# Patient Record
Sex: Female | Born: 1965
Health system: Southern US, Community
[De-identification: ages and names within clinical notes are randomized; demographics above are authoritative.]

## PROBLEM LIST (undated history)

## (undated) DIAGNOSIS — K7581 Nonalcoholic steatohepatitis (NASH): Secondary | ICD-10-CM

## (undated) DIAGNOSIS — K449 Diaphragmatic hernia without obstruction or gangrene: Secondary | ICD-10-CM

## (undated) DIAGNOSIS — K219 Gastro-esophageal reflux disease without esophagitis: Secondary | ICD-10-CM

## (undated) DIAGNOSIS — E039 Hypothyroidism, unspecified: Secondary | ICD-10-CM

## (undated) DIAGNOSIS — T7840XA Allergy, unspecified, initial encounter: Secondary | ICD-10-CM

## (undated) DIAGNOSIS — K589 Irritable bowel syndrome without diarrhea: Secondary | ICD-10-CM

## (undated) DIAGNOSIS — K5790 Diverticulosis of intestine, part unspecified, without perforation or abscess without bleeding: Secondary | ICD-10-CM

## (undated) HISTORY — DX: Irritable bowel syndrome, unspecified: K58.9

## (undated) HISTORY — PX: APPENDECTOMY: SHX54

## (undated) HISTORY — DX: Diverticulosis of intestine, part unspecified, without perforation or abscess without bleeding: K57.90

## (undated) HISTORY — DX: Gilbert syndrome: E80.4

## (undated) HISTORY — PX: UPPER GASTROINTESTINAL ENDOSCOPY: SHX188

## (undated) HISTORY — PX: OTHER SURGICAL HISTORY: SHX169

## (undated) HISTORY — DX: Gastro-esophageal reflux disease without esophagitis: K21.9

## (undated) HISTORY — DX: Hypothyroidism, unspecified: E03.9

## (undated) HISTORY — DX: Nonalcoholic steatohepatitis (NASH): K75.81

## (undated) HISTORY — PX: PARTIAL HYSTERECTOMY: SHX80

## (undated) HISTORY — DX: Allergy, unspecified, initial encounter: T78.40XA

## (undated) HISTORY — DX: Diaphragmatic hernia without obstruction or gangrene: K44.9

---

## 2003-02-25 HISTORY — PX: KIDNEY STONE SURGERY: SHX686

## 2008-02-25 DIAGNOSIS — K9041 Non-celiac gluten sensitivity: Secondary | ICD-10-CM

## 2008-02-25 HISTORY — DX: Non-celiac gluten sensitivity: K90.41

## 2010-08-01 HISTORY — PX: COLONOSCOPY: SHX174

## 2013-06-02 HISTORY — PX: ESOPHAGOGASTRODUODENOSCOPY: SHX1529

## 2015-06-07 DIAGNOSIS — G43819 Other migraine, intractable, without status migrainosus: Secondary | ICD-10-CM | POA: Diagnosis not present

## 2015-06-07 DIAGNOSIS — K900 Celiac disease: Secondary | ICD-10-CM | POA: Diagnosis not present

## 2015-06-07 DIAGNOSIS — K5900 Constipation, unspecified: Secondary | ICD-10-CM | POA: Diagnosis not present

## 2015-06-07 DIAGNOSIS — R140 Abdominal distension (gaseous): Secondary | ICD-10-CM | POA: Diagnosis not present

## 2015-07-05 DIAGNOSIS — N951 Menopausal and female climacteric states: Secondary | ICD-10-CM | POA: Diagnosis not present

## 2015-07-05 DIAGNOSIS — R319 Hematuria, unspecified: Secondary | ICD-10-CM | POA: Diagnosis not present

## 2015-07-05 DIAGNOSIS — Z01419 Encounter for gynecological examination (general) (routine) without abnormal findings: Secondary | ICD-10-CM | POA: Diagnosis not present

## 2015-07-06 DIAGNOSIS — R319 Hematuria, unspecified: Secondary | ICD-10-CM | POA: Diagnosis not present

## 2015-07-12 DIAGNOSIS — G43819 Other migraine, intractable, without status migrainosus: Secondary | ICD-10-CM | POA: Diagnosis not present

## 2015-07-12 DIAGNOSIS — K900 Celiac disease: Secondary | ICD-10-CM | POA: Diagnosis not present

## 2015-07-12 DIAGNOSIS — R140 Abdominal distension (gaseous): Secondary | ICD-10-CM | POA: Diagnosis not present

## 2015-07-12 DIAGNOSIS — K5900 Constipation, unspecified: Secondary | ICD-10-CM | POA: Diagnosis not present

## 2015-08-13 DIAGNOSIS — Z1231 Encounter for screening mammogram for malignant neoplasm of breast: Secondary | ICD-10-CM | POA: Diagnosis not present

## 2015-08-24 DIAGNOSIS — R928 Other abnormal and inconclusive findings on diagnostic imaging of breast: Secondary | ICD-10-CM | POA: Diagnosis not present

## 2015-08-27 DIAGNOSIS — E039 Hypothyroidism, unspecified: Secondary | ICD-10-CM | POA: Diagnosis not present

## 2015-09-11 DIAGNOSIS — K581 Irritable bowel syndrome with constipation: Secondary | ICD-10-CM | POA: Diagnosis not present

## 2015-09-11 DIAGNOSIS — K760 Fatty (change of) liver, not elsewhere classified: Secondary | ICD-10-CM | POA: Diagnosis not present

## 2015-09-11 DIAGNOSIS — K900 Celiac disease: Secondary | ICD-10-CM | POA: Diagnosis not present

## 2015-11-20 DIAGNOSIS — K900 Celiac disease: Secondary | ICD-10-CM | POA: Diagnosis not present

## 2015-11-20 DIAGNOSIS — K760 Fatty (change of) liver, not elsewhere classified: Secondary | ICD-10-CM | POA: Diagnosis not present

## 2015-11-26 DIAGNOSIS — R1013 Epigastric pain: Secondary | ICD-10-CM | POA: Diagnosis not present

## 2015-12-27 DIAGNOSIS — E039 Hypothyroidism, unspecified: Secondary | ICD-10-CM | POA: Diagnosis not present

## 2015-12-27 DIAGNOSIS — J0100 Acute maxillary sinusitis, unspecified: Secondary | ICD-10-CM | POA: Diagnosis not present

## 2016-01-08 DIAGNOSIS — R110 Nausea: Secondary | ICD-10-CM | POA: Diagnosis not present

## 2016-01-08 DIAGNOSIS — K9041 Non-celiac gluten sensitivity: Secondary | ICD-10-CM | POA: Diagnosis not present

## 2016-01-08 DIAGNOSIS — R1013 Epigastric pain: Secondary | ICD-10-CM | POA: Diagnosis not present

## 2016-02-13 DIAGNOSIS — J00 Acute nasopharyngitis [common cold]: Secondary | ICD-10-CM | POA: Diagnosis not present

## 2016-03-01 DIAGNOSIS — T50995A Adverse effect of other drugs, medicaments and biological substances, initial encounter: Secondary | ICD-10-CM | POA: Diagnosis not present

## 2016-03-05 DIAGNOSIS — Z09 Encounter for follow-up examination after completed treatment for conditions other than malignant neoplasm: Secondary | ICD-10-CM | POA: Diagnosis not present

## 2016-03-05 DIAGNOSIS — R922 Inconclusive mammogram: Secondary | ICD-10-CM | POA: Diagnosis not present

## 2016-03-05 DIAGNOSIS — N6459 Other signs and symptoms in breast: Secondary | ICD-10-CM | POA: Diagnosis not present

## 2016-03-25 DIAGNOSIS — E039 Hypothyroidism, unspecified: Secondary | ICD-10-CM | POA: Diagnosis not present

## 2016-04-08 DIAGNOSIS — G43909 Migraine, unspecified, not intractable, without status migrainosus: Secondary | ICD-10-CM | POA: Diagnosis not present

## 2016-04-08 DIAGNOSIS — Z0000 Encounter for general adult medical examination without abnormal findings: Secondary | ICD-10-CM | POA: Diagnosis not present

## 2016-04-08 DIAGNOSIS — R790 Abnormal level of blood mineral: Secondary | ICD-10-CM | POA: Diagnosis not present

## 2016-04-08 DIAGNOSIS — Z131 Encounter for screening for diabetes mellitus: Secondary | ICD-10-CM | POA: Diagnosis not present

## 2016-04-08 DIAGNOSIS — E039 Hypothyroidism, unspecified: Secondary | ICD-10-CM | POA: Diagnosis not present

## 2016-04-09 DIAGNOSIS — Z79899 Other long term (current) drug therapy: Secondary | ICD-10-CM | POA: Diagnosis not present

## 2016-04-09 DIAGNOSIS — E039 Hypothyroidism, unspecified: Secondary | ICD-10-CM | POA: Diagnosis not present

## 2016-07-18 DIAGNOSIS — E039 Hypothyroidism, unspecified: Secondary | ICD-10-CM | POA: Diagnosis not present

## 2016-09-23 DIAGNOSIS — D485 Neoplasm of uncertain behavior of skin: Secondary | ICD-10-CM | POA: Diagnosis not present

## 2016-09-23 DIAGNOSIS — D1801 Hemangioma of skin and subcutaneous tissue: Secondary | ICD-10-CM | POA: Diagnosis not present

## 2016-11-04 DIAGNOSIS — M79642 Pain in left hand: Secondary | ICD-10-CM | POA: Diagnosis not present

## 2016-11-04 DIAGNOSIS — Z6821 Body mass index (BMI) 21.0-21.9, adult: Secondary | ICD-10-CM | POA: Diagnosis not present

## 2016-11-04 DIAGNOSIS — R202 Paresthesia of skin: Secondary | ICD-10-CM | POA: Diagnosis not present

## 2016-11-04 DIAGNOSIS — K760 Fatty (change of) liver, not elsewhere classified: Secondary | ICD-10-CM | POA: Diagnosis not present

## 2016-11-25 DIAGNOSIS — K760 Fatty (change of) liver, not elsewhere classified: Secondary | ICD-10-CM | POA: Diagnosis not present

## 2016-11-25 DIAGNOSIS — K9041 Non-celiac gluten sensitivity: Secondary | ICD-10-CM | POA: Diagnosis not present

## 2017-01-29 DIAGNOSIS — D485 Neoplasm of uncertain behavior of skin: Secondary | ICD-10-CM | POA: Diagnosis not present

## 2017-02-04 DIAGNOSIS — E039 Hypothyroidism, unspecified: Secondary | ICD-10-CM | POA: Diagnosis not present

## 2017-04-16 DIAGNOSIS — E039 Hypothyroidism, unspecified: Secondary | ICD-10-CM | POA: Diagnosis not present

## 2017-04-16 DIAGNOSIS — Z6821 Body mass index (BMI) 21.0-21.9, adult: Secondary | ICD-10-CM | POA: Diagnosis not present

## 2017-04-16 DIAGNOSIS — Z1331 Encounter for screening for depression: Secondary | ICD-10-CM | POA: Diagnosis not present

## 2017-04-16 DIAGNOSIS — E7212 Methylenetetrahydrofolate reductase deficiency: Secondary | ICD-10-CM | POA: Diagnosis not present

## 2017-06-22 DIAGNOSIS — M545 Low back pain: Secondary | ICD-10-CM | POA: Diagnosis not present

## 2017-06-22 DIAGNOSIS — M546 Pain in thoracic spine: Secondary | ICD-10-CM | POA: Diagnosis not present

## 2017-06-22 DIAGNOSIS — M542 Cervicalgia: Secondary | ICD-10-CM | POA: Diagnosis not present

## 2017-06-23 DIAGNOSIS — M546 Pain in thoracic spine: Secondary | ICD-10-CM | POA: Diagnosis not present

## 2017-06-23 DIAGNOSIS — M542 Cervicalgia: Secondary | ICD-10-CM | POA: Diagnosis not present

## 2017-06-23 DIAGNOSIS — M545 Low back pain: Secondary | ICD-10-CM | POA: Diagnosis not present

## 2017-06-25 DIAGNOSIS — M545 Low back pain: Secondary | ICD-10-CM | POA: Diagnosis not present

## 2017-06-25 DIAGNOSIS — M542 Cervicalgia: Secondary | ICD-10-CM | POA: Diagnosis not present

## 2017-06-25 DIAGNOSIS — M546 Pain in thoracic spine: Secondary | ICD-10-CM | POA: Diagnosis not present

## 2017-07-01 DIAGNOSIS — M546 Pain in thoracic spine: Secondary | ICD-10-CM | POA: Diagnosis not present

## 2017-07-01 DIAGNOSIS — M542 Cervicalgia: Secondary | ICD-10-CM | POA: Diagnosis not present

## 2017-07-01 DIAGNOSIS — M545 Low back pain: Secondary | ICD-10-CM | POA: Diagnosis not present

## 2017-08-06 DIAGNOSIS — E039 Hypothyroidism, unspecified: Secondary | ICD-10-CM | POA: Diagnosis not present

## 2017-08-19 DIAGNOSIS — N951 Menopausal and female climacteric states: Secondary | ICD-10-CM | POA: Diagnosis not present

## 2017-08-19 DIAGNOSIS — Z1231 Encounter for screening mammogram for malignant neoplasm of breast: Secondary | ICD-10-CM | POA: Diagnosis not present

## 2017-08-19 DIAGNOSIS — Z01419 Encounter for gynecological examination (general) (routine) without abnormal findings: Secondary | ICD-10-CM | POA: Diagnosis not present

## 2017-09-01 DIAGNOSIS — Z1231 Encounter for screening mammogram for malignant neoplasm of breast: Secondary | ICD-10-CM | POA: Diagnosis not present

## 2017-11-27 DIAGNOSIS — G43819 Other migraine, intractable, without status migrainosus: Secondary | ICD-10-CM | POA: Diagnosis not present

## 2017-11-27 DIAGNOSIS — G5711 Meralgia paresthetica, right lower limb: Secondary | ICD-10-CM | POA: Diagnosis not present

## 2017-11-27 DIAGNOSIS — N951 Menopausal and female climacteric states: Secondary | ICD-10-CM | POA: Diagnosis not present

## 2017-11-27 DIAGNOSIS — Z23 Encounter for immunization: Secondary | ICD-10-CM | POA: Diagnosis not present

## 2017-11-27 DIAGNOSIS — J3089 Other allergic rhinitis: Secondary | ICD-10-CM | POA: Diagnosis not present

## 2017-11-27 DIAGNOSIS — R490 Dysphonia: Secondary | ICD-10-CM | POA: Diagnosis not present

## 2017-11-27 DIAGNOSIS — K599 Functional intestinal disorder, unspecified: Secondary | ICD-10-CM | POA: Diagnosis not present

## 2017-11-27 DIAGNOSIS — R140 Abdominal distension (gaseous): Secondary | ICD-10-CM | POA: Diagnosis not present

## 2017-12-03 DIAGNOSIS — H00011 Hordeolum externum right upper eyelid: Secondary | ICD-10-CM | POA: Diagnosis not present

## 2018-02-08 DIAGNOSIS — E039 Hypothyroidism, unspecified: Secondary | ICD-10-CM | POA: Diagnosis not present

## 2018-08-10 DIAGNOSIS — E039 Hypothyroidism, unspecified: Secondary | ICD-10-CM | POA: Diagnosis not present

## 2018-08-23 ENCOUNTER — Telehealth: Payer: Self-pay | Admitting: Gastroenterology

## 2018-08-23 NOTE — Telephone Encounter (Signed)
The pt was called and she states she is on a conference call for her work and will have to call back at a later time.

## 2018-08-23 NOTE — Telephone Encounter (Signed)
The pt asked if she needed to be checked for C diff.  She has been on an antibiotic recently for an eye infection.  She is currently at [facility].  She has no diarrhea and no fever.  She says she only had some rectal pressure which got significantly better.  She has an appt with Dr Lyndel Safe on 7/15.  She was advised that if she continues to have symptoms she could see an urgent care in [place].  The pt has been advised of the information and verbalized understanding.

## 2018-08-23 NOTE — Telephone Encounter (Signed)
Patient was taking cephalexin and was starting to have GI side effect and does not know if she should have to get a C. Diff test done. She would like to speak to someone

## 2018-08-31 ENCOUNTER — Encounter: Payer: Self-pay | Admitting: Gastroenterology

## 2018-09-08 ENCOUNTER — Other Ambulatory Visit: Payer: Self-pay

## 2018-09-08 ENCOUNTER — Encounter: Payer: Self-pay | Admitting: Gastroenterology

## 2018-09-08 ENCOUNTER — Telehealth (INDEPENDENT_AMBULATORY_CARE_PROVIDER_SITE_OTHER): Payer: BC Managed Care – PPO | Admitting: Gastroenterology

## 2018-09-08 VITALS — Ht 63.0 in | Wt 124.0 lb

## 2018-09-08 DIAGNOSIS — K9041 Non-celiac gluten sensitivity: Secondary | ICD-10-CM

## 2018-09-08 DIAGNOSIS — R140 Abdominal distension (gaseous): Secondary | ICD-10-CM

## 2018-09-08 DIAGNOSIS — K581 Irritable bowel syndrome with constipation: Secondary | ICD-10-CM

## 2018-09-08 NOTE — Progress Notes (Signed)
Terri Matthews  Pl add PMH/PSH/GI procedures from last [facility] note  Thx  RG

## 2018-09-08 NOTE — Patient Instructions (Addendum)
If you are age 53 or older, your body mass index should be between 23-30. Your Body mass index is 21.97 kg/m. If this is out of the aforementioned range listed, please consider follow up with your Primary Care Provider.  If you are age 63 or younger, your body mass index should be between 19-25. Your Body mass index is 21.97 kg/m. If this is out of the aformentioned range listed, please consider follow up with your Primary Care Provider.    You have been scheduled for an abdominal ultrasound at [facility] (1st floor [address] ) on 09/15/18 at 9:00am. Please arrive 15 minutes prior to your appointment for registration. Make certain not to have anything to eat or drink 6 hours prior to your appointment. Should you need to reschedule your appointment, please contact radiology at [phone]. This test typically takes about 30 minutes to perform.  Please go to the lab at [facility] ([place] You will need to go to level "B", you do not need an appointment for this. Hours available are 7:30 am - 4:30 pm.   Follow up 6 months.   Thank you,  Dr. Jackquline Denmark

## 2018-09-08 NOTE — Progress Notes (Signed)
Chief Complaint: FU  Referring Provider: Dr. Maryjean Kah[address] Street at [facility]      ASSESSMENT AND PLAN;   #1. NAFLD (on Liver Bx 05/2009-showing capsular fibrosis, mild NASH). Neg acute viral hepatitis panel, autoimmune work-up, iron studies, ATP7B for Wilson's disease, celiac screen, A1AT.  #2.  Non-celiac gluten sensitivity #3.  Bloating #4.  Neg screening colonoscopy 08/01/2010 Dr Linus Ornudy [facility]negative.  Next colonoscopy due 07/2020 #5.  IBS with predominant constipation.  Plan: - Check CBC, CMP, iron studies, B12, folate and Mg. - US abdo compelte. - If still with problems, HIDA with EF. - She should make appointment with Dr. Casper HarrisonStreet for routine physical. - FU in 6 months.  Earlier, if with any problems. [name] to and past medical history/past surgical history/GI procedures.    HPI:    Terri Matthews is a 53 y.o. female  Recent stye, took antibiotics. Had abdominal bloating without diarrhea.  The bloating has continued.  It occasionally gets worse.  No abdominal pain.  She has not changed any of her medications.  She has been very strict with gluten-free diet.  Also lactose-free.  No nausea, vomiting, heartburn, regurgitation, odynophagia or dysphagia.  No significant diarrhea or constipation.  There is no melena or hematochezia. No unintentional weight loss. No abdo pain   SH- [place]oved from [place] Married  Past surgical history: -Appendicectomy -History of kidney stones -Partial hysterectomy with partial small bowel resection 2011 due to endometriosis. -Liver biopsy 06/11/2009 showing mild steatosis Past Medical History:  Diagnosis Date  . Diaphragmatic hernia without obstruction or gangrene   . Hypothyroidism   . IBS (irritable bowel syndrome)   . NASH (nonalcoholic steatohepatitis)     No family history on file.  Social History   Tobacco Use  . Smoking status: Not on file  Substance Use Topics  . Alcohol use: Not on  file  . Drug use: Not on file    Current Outpatient Medications  Medication Sig Dispense Refill  . acidophilus (RISAQUAD) CAPS capsule Take by mouth daily.    . magnesium 30 MG tablet Take 30 mg by mouth daily.    . Multiple Vitamin (MULTIVITAMIN) tablet Take 1 tablet by mouth daily.    . rizatriptan (MAXALT) 10 MG tablet TAKE 1 TABLET BY MOUTH AT THE ONSET OF A MIGRAINE    . thyroid (NP THYROID) 30 MG tablet Take by mouth daily.     No current facility-administered medications for this visit.     Allergies  Allergen Reactions  . Sulfa Antibiotics     Review of Systems:  Constitutional: Denies fever, chills, diaphoresis, appetite change and fatigue.  HEENT: Denies photophobia, eye pain, redness, hearing loss, ear pain, congestion, sore throat, rhinorrhea, sneezing, mouth sores, neck pain, neck stiffness and tinnitus.   Respiratory: Denies SOB, DOE, cough, chest tightness,  and wheezing.   Cardiovascular: Denies chest pain, palpitations and leg swelling.  Genitourinary: Denies dysuria, urgency, frequency, hematuria, flank pain and difficulty urinating.  Musculoskeletal: Denies myalgias, back pain, joint swelling, arthralgias and gait problem.  Skin: No rash.  Neurological: Denies dizziness, seizures, syncope, weakness, light-headedness, numbness and headaches.  Hematological: Denies adenopathy. Easy bruising, personal or family bleeding history  Psychiatric/Behavioral: No anxiety or depression     Physical Exam:    Ht 5\' 3"  (1.6 m)   Wt 124 lb (56.2 kg)   BMI 21.97 kg/m  Filed Weights   09/08/18 0910  Weight: 124 lb (56.2 kg)  Data Reviewed: I have personally reviewed following labs and imaging studies   This service was provided via telemedicine.  The patient was located at home.  The provider was located in office.  The patient did consent to this telephone visit and is aware of possible charges through their insurance for this visit.    Time spent on call: 25 min   Carmell Austria, MD 09/08/2018, 10:41 AM  Cc: Dr Venetia Maxon.

## 2018-09-09 ENCOUNTER — Encounter: Payer: Self-pay | Admitting: Gastroenterology

## 2018-09-15 ENCOUNTER — Other Ambulatory Visit (INDEPENDENT_AMBULATORY_CARE_PROVIDER_SITE_OTHER): Payer: BC Managed Care – PPO

## 2018-09-15 ENCOUNTER — Ambulatory Visit (HOSPITAL_BASED_OUTPATIENT_CLINIC_OR_DEPARTMENT_OTHER)
Admission: RE | Admit: 2018-09-15 | Discharge: 2018-09-15 | Disposition: A | Discharge status: Home / Self Care | Payer: BC Managed Care – PPO | Source: Ambulatory Visit | Attending: Gastroenterology | Admitting: Gastroenterology

## 2018-09-15 ENCOUNTER — Other Ambulatory Visit: Payer: Self-pay

## 2018-09-15 DIAGNOSIS — K9041 Non-celiac gluten sensitivity: Secondary | ICD-10-CM | POA: Diagnosis not present

## 2018-09-15 DIAGNOSIS — K581 Irritable bowel syndrome with constipation: Secondary | ICD-10-CM

## 2018-09-15 DIAGNOSIS — R140 Abdominal distension (gaseous): Secondary | ICD-10-CM

## 2018-09-15 LAB — CBC WITH DIFFERENTIAL/PLATELET
Basophils Absolute: 0 10*3/uL (ref 0.0–0.1)
Basophils Relative: 0.7 % (ref 0.0–3.0)
Eosinophils Absolute: 0.1 10*3/uL (ref 0.0–0.7)
Eosinophils Relative: 2.9 % (ref 0.0–5.0)
HCT: 43 % (ref 36.0–46.0)
Hemoglobin: 14.3 g/dL (ref 12.0–15.0)
Lymphocytes Relative: 33 % (ref 12.0–46.0)
Lymphs Abs: 1.5 10*3/uL (ref 0.7–4.0)
MCHC: 33.2 g/dL (ref 30.0–36.0)
MCV: 94.7 fl (ref 78.0–100.0)
Monocytes Absolute: 0.4 10*3/uL (ref 0.1–1.0)
Monocytes Relative: 7.9 % (ref 3.0–12.0)
Neutro Abs: 2.6 10*3/uL (ref 1.4–7.7)
Neutrophils Relative %: 55.5 % (ref 43.0–77.0)
Platelets: 211 10*3/uL (ref 150.0–400.0)
RBC: 4.54 Mil/uL (ref 3.87–5.11)
RDW: 13.2 % (ref 11.5–15.5)
WBC: 4.7 10*3/uL (ref 4.0–10.5)

## 2018-09-15 LAB — COMPREHENSIVE METABOLIC PANEL
ALT: 16 U/L (ref 0–35)
AST: 16 U/L (ref 0–37)
Albumin: 4.3 g/dL (ref 3.5–5.2)
Alkaline Phosphatase: 52 U/L (ref 39–117)
BUN: 8 mg/dL (ref 6–23)
CO2: 30 mEq/L (ref 19–32)
Calcium: 9.3 mg/dL (ref 8.4–10.5)
Chloride: 104 mEq/L (ref 96–112)
Creatinine, Ser: 0.82 mg/dL (ref 0.40–1.20)
GFR: 73.03 mL/min (ref >60.00)
Glucose, Bld: 109 mg/dL — ABNORMAL HIGH (ref 70–99)
Potassium: 3.6 mEq/L (ref 3.5–5.1)
Sodium: 142 mEq/L (ref 135–145)
Total Bilirubin: 1 mg/dL (ref 0.2–1.2)
Total Protein: 6.8 g/dL (ref 6.0–8.3)

## 2018-09-15 LAB — MAGNESIUM: Magnesium: 2 mg/dL (ref 1.5–2.5)

## 2018-09-15 LAB — FOLATE: Folate: >23.9 ng/mL (ref >5.9)

## 2018-09-15 LAB — VITAMIN B12: Vitamin B-12: 1151 pg/mL — ABNORMAL HIGH (ref 211–911)

## 2018-09-16 LAB — IRON, TOTAL/TOTAL IRON BINDING CAP
%SAT: 50 % (calc) — ABNORMAL HIGH (ref 16–45)
Iron: 152 ug/dL (ref 45–160)
TIBC: 305 mcg/dL (calc) (ref 250–450)

## 2018-09-17 ENCOUNTER — Telehealth: Payer: Self-pay | Admitting: Gastroenterology

## 2018-09-17 NOTE — Telephone Encounter (Signed)
Called and spoke with patient- patient is requesting to know if she needs additional testing due to her Vitamin B12 level being increased and her strong family hx of liver complications; please advise if additional testing (not the HIDA scan-as she had this test a few years ago) is needed Please advise

## 2018-09-17 NOTE — Telephone Encounter (Signed)
Pt requested a call back to discuss [place] results.

## 2018-09-18 NOTE — Telephone Encounter (Signed)
Liver looked great on ultrasound.  Even the blood flow was normal on Doppler. Liver tests (blood tests) were all normal. Gallbladder looked good on ultrasound.  B12 level was mildly elevated.  Could have been because of recent infection she had.  She should discuss that with Dr. Venetia Maxon when she has her physical.  Please send ultrasound report, all blood tests to Dr. Venetia Maxon.   RG

## 2018-09-20 NOTE — Telephone Encounter (Signed)
Left message for patient to call back  

## 2018-09-21 NOTE — Telephone Encounter (Signed)
Called and spoke with patient-patient informed of result note and MD recommendations; patient is agreeable with plan of care and verified PCP; result note, lab work, and [place] results routed to PCP per MD recommendations; Patient reports she will follow up with her PCP for a physical; Patient verbalized understanding of information/instructions;  Patient was advised to call the office at [phone] if questions/concerns arise;

## 2018-09-21 NOTE — Telephone Encounter (Signed)
Patient would like a call back at [phone]

## 2018-09-30 DIAGNOSIS — Z1231 Encounter for screening mammogram for malignant neoplasm of breast: Secondary | ICD-10-CM | POA: Diagnosis not present

## 2018-10-15 DIAGNOSIS — E039 Hypothyroidism, unspecified: Secondary | ICD-10-CM | POA: Diagnosis not present

## 2018-10-15 DIAGNOSIS — Z131 Encounter for screening for diabetes mellitus: Secondary | ICD-10-CM | POA: Diagnosis not present

## 2018-10-15 DIAGNOSIS — Z13220 Encounter for screening for lipoid disorders: Secondary | ICD-10-CM | POA: Diagnosis not present

## 2018-10-15 DIAGNOSIS — Z0000 Encounter for general adult medical examination without abnormal findings: Secondary | ICD-10-CM | POA: Diagnosis not present

## 2018-10-15 DIAGNOSIS — K900 Celiac disease: Secondary | ICD-10-CM | POA: Diagnosis not present

## 2018-10-15 DIAGNOSIS — D519 Vitamin B12 deficiency anemia, unspecified: Secondary | ICD-10-CM | POA: Diagnosis not present

## 2018-10-19 DIAGNOSIS — N6489 Other specified disorders of breast: Secondary | ICD-10-CM | POA: Diagnosis not present

## 2018-10-19 DIAGNOSIS — R928 Other abnormal and inconclusive findings on diagnostic imaging of breast: Secondary | ICD-10-CM | POA: Diagnosis not present

## 2018-10-19 DIAGNOSIS — R922 Inconclusive mammogram: Secondary | ICD-10-CM | POA: Diagnosis not present

## 2018-10-22 DIAGNOSIS — Z1331 Encounter for screening for depression: Secondary | ICD-10-CM | POA: Diagnosis not present

## 2018-10-22 DIAGNOSIS — Z0000 Encounter for general adult medical examination without abnormal findings: Secondary | ICD-10-CM | POA: Diagnosis not present

## 2018-10-22 DIAGNOSIS — Z6821 Body mass index (BMI) 21.0-21.9, adult: Secondary | ICD-10-CM | POA: Diagnosis not present

## 2018-11-16 DIAGNOSIS — Z1239 Encounter for other screening for malignant neoplasm of breast: Secondary | ICD-10-CM | POA: Diagnosis not present

## 2018-11-16 DIAGNOSIS — Z01419 Encounter for gynecological examination (general) (routine) without abnormal findings: Secondary | ICD-10-CM | POA: Diagnosis not present

## 2018-11-16 DIAGNOSIS — M25511 Pain in right shoulder: Secondary | ICD-10-CM | POA: Diagnosis not present

## 2018-11-17 DIAGNOSIS — Z01419 Encounter for gynecological examination (general) (routine) without abnormal findings: Secondary | ICD-10-CM | POA: Diagnosis not present

## 2018-12-21 DIAGNOSIS — Z20828 Contact with and (suspected) exposure to other viral communicable diseases: Secondary | ICD-10-CM | POA: Diagnosis not present

## 2019-02-09 DIAGNOSIS — E039 Hypothyroidism, unspecified: Secondary | ICD-10-CM | POA: Diagnosis not present

## 2019-06-21 DIAGNOSIS — Z23 Encounter for immunization: Secondary | ICD-10-CM | POA: Diagnosis not present

## 2019-07-19 DIAGNOSIS — L600 Ingrowing nail: Secondary | ICD-10-CM | POA: Diagnosis not present

## 2019-07-19 DIAGNOSIS — Z23 Encounter for immunization: Secondary | ICD-10-CM | POA: Diagnosis not present

## 2019-08-10 DIAGNOSIS — E039 Hypothyroidism, unspecified: Secondary | ICD-10-CM | POA: Diagnosis not present

## 2019-08-30 ENCOUNTER — Telehealth: Payer: Self-pay | Admitting: Gastroenterology

## 2019-08-30 NOTE — Telephone Encounter (Signed)
Spoke to patient who states that she would like to discuss what type of imaging would best benefit  her due to her non celiac gluten sensitivity. She was due for an appointment 6 months ago. A follow up appointment with Dr Chales Abrahams scheduled to discuss  imaging options.

## 2019-10-04 DIAGNOSIS — N951 Menopausal and female climacteric states: Secondary | ICD-10-CM | POA: Diagnosis not present

## 2019-10-05 ENCOUNTER — Ambulatory Visit (INDEPENDENT_AMBULATORY_CARE_PROVIDER_SITE_OTHER): Payer: BC Managed Care – PPO | Admitting: Gastroenterology

## 2019-10-05 ENCOUNTER — Other Ambulatory Visit: Payer: Self-pay

## 2019-10-05 ENCOUNTER — Encounter: Payer: Self-pay | Admitting: Gastroenterology

## 2019-10-05 ENCOUNTER — Other Ambulatory Visit: Payer: Self-pay | Admitting: Gastroenterology

## 2019-10-05 VITALS — BP 106/78 | HR 75 | Ht 64.0 in | Wt 125.0 lb

## 2019-10-05 DIAGNOSIS — K9041 Non-celiac gluten sensitivity: Secondary | ICD-10-CM

## 2019-10-05 DIAGNOSIS — K760 Fatty (change of) liver, not elsewhere classified: Secondary | ICD-10-CM

## 2019-10-05 DIAGNOSIS — D581 Hereditary elliptocytosis: Secondary | ICD-10-CM | POA: Diagnosis not present

## 2019-10-05 DIAGNOSIS — R10813 Right lower quadrant abdominal tenderness: Secondary | ICD-10-CM | POA: Diagnosis not present

## 2019-10-05 DIAGNOSIS — K581 Irritable bowel syndrome with constipation: Secondary | ICD-10-CM

## 2019-10-05 NOTE — Progress Notes (Signed)
Chief Complaint: FU  Referring Provider: Dr. Maryjean Ka at [facility]      ASSESSMENT AND PLAN;   #1. NAFLD (on Liver Bx 05/2009-showing capsular fibrosis, mild NASH). Neg acute viral hepatitis panel, autoimmune work-up, iron studies, ATP7B for Wilson's disease, celiac screen, A1AT. [place] neg 09/15/2018. Nl LFTs #2.  Non-celiac gluten sensitivity #3.  New onset RLQ pain with tenderness. H/O prior appendicectomy. #4.  Bloating #5.  Neg screening colon 08/01/2010 Dr Linus Orn [facility].  Next colonoscopy due 07/2020 #6.  IBS with predominant constipation.  Diet controlled  Plan: - Check CBC, CMP - CT AP with p.o. and IV contrast - Screening colon 07/2020.  Certainly, earlier if with continued problems. - Continue current gluten/soy/lactose free diet. - If continued problems, would consider EGD/colonoscopy followed by hydrogen breath test. - FU in 6 months.    HPI:    Terri Matthews is a 54 y.o. female   FU  RLQ pain with left back with worsening bloating  NO constipation as long as she keeps up with her water intake and salads.  No diarrhea.  She has been moving her bowels once or twice per day.  No melena or hematochezia.  Has been complaining of being tired  No Blood in the stool or urine.   SH- Moved from [place], [place]. Married  Past surgical history: -Appendicectomy -History of kidney stones -Partial hysterectomy with partial small bowel resection 2011 due to endometriosis. -Liver biopsy 06/11/2009 showing mild steatosis Past Medical History:  Diagnosis Date  . Diaphragmatic hernia without obstruction or gangrene   . Diverticulosis   . GERD (gastroesophageal reflux disease)   . Gilbert syndrome   . Hypothyroidism   . IBS (irritable bowel syndrome)   . NASH (nonalcoholic steatohepatitis)    Past Surgical History:  Procedure Laterality Date  . Abdominal laprascopy     x3 for endometriosis  . APPENDECTOMY    . COLONOSCOPY   08/01/2010  . ESOPHAGOGASTRODUODENOSCOPY  06/02/2013   Minimal transient hiatal hernia. Mild gastrtiis. Otherwise normal EGD.   . Ganglion cysts removal    . PARTIAL HYSTERECTOMY      Family History  Problem Relation Age of Onset  . Liver cancer Paternal Aunt        stage 4 liver cancer   . Colon cancer Neg Hx     Social History   Tobacco Use  . Smoking status: Never Smoker  . Smokeless tobacco: Never Used  Vaping Use  . Vaping Use: Never used  Substance Use Topics  . Alcohol use: Yes    Comment: 2 glasses a month  . Drug use: Never    Current Outpatient Medications  Medication Sig Dispense Refill  . acidophilus (RISAQUAD) CAPS capsule Take by mouth daily.    . magnesium 30 MG tablet Take 30 mg by mouth daily.    . Multiple Vitamin (MULTIVITAMIN) tablet Take 1 tablet by mouth daily.    . Polyvinyl Alcohol-Povidone (REFRESH OP) Apply to eye.    . rizatriptan (MAXALT) 10 MG tablet TAKE 1 TABLET BY MOUTH AT THE ONSET OF A MIGRAINE    . thyroid (NP THYROID) 30 MG tablet Take by mouth daily.    . White Petrolatum-Mineral Oil (ARTIFICIAL TEARS) ointment daily.     No current facility-administered medications for this visit.    Allergies  Allergen Reactions  . Erythromycin Hives    Upset stomach   . Sulfa Antibiotics     Review of Systems:  neg     Physical Exam:    BP 106/78   Pulse 75   Ht 5\' 4"  (1.626 m)   Wt 125 lb (56.7 kg)   BMI 21.46 kg/m  Filed Weights   10/05/19 1451  Weight: 125 lb (56.7 kg)  Gen: awake, alert, NAD HEENT: anicteric, no pallor CV: RRR, no mrg Pulm: CTA b/l Abd: soft, RLQ tenderness without rebound, +BS throughout Ext: no c/c/e Neuro: nonfocal   12/05/19, MD 10/05/2019, 2:59 PM  Cc: Dr 12/05/2019.

## 2019-10-05 NOTE — Patient Instructions (Signed)
If you are age 54 or older, your body mass index should be between 23-30. Your Body mass index is 21.46 kg/m. If this is out of the aforementioned range listed, please consider follow up with your Primary Care Provider.  If you are age 86 or younger, your body mass index should be between 19-25. Your Body mass index is 21.46 kg/m. If this is out of the aformentioned range listed, please consider follow up with your Primary Care Provider.   You have been scheduled for a CT scan of the abdomen and pelvis at [facility].  You are scheduled on      at          You should arrive 15 minutes prior to your appointment time for registration. Please follow the written instructions below on the day of your exam:  WARNING: IF YOU ARE ALLERGIC TO IODINE/X-RAY DYE, PLEASE NOTIFY RADIOLOGY IMMEDIATELY AT [phone]! YOU WILL BE GIVEN A 13 HOUR PREMEDICATION PREP.  1) Do not eat or drink anything after      (4 hours prior to your test) 2) You have been given 2 bottles of oral contrast to drink. The solution may taste better if refrigerated, but do NOT add ice or any other liquid to this solution. Shake well before drinking.    Drink 1 bottle of contrast @           (2 hours prior to your exam)  Drink 1 bottle of contrast @           (1 hour prior to your exam)  You may take any medications as prescribed with a small amount of water, if necessary. If you take any of the following medications: METFORMIN, GLUCOPHAGE, GLUCOVANCE, AVANDAMET, RIOMET, FORTAMET, Steele MET, JANUMET, GLUMETZA or METAGLIP, you MAY be asked to HOLD this medication 48 hours AFTER the exam.  The purpose of you drinking the oral contrast is to aid in the visualization of your intestinal tract. The contrast solution may cause some diarrhea. Depending on your individual set of symptoms, you may also receive an intravenous injection of x-ray contrast/dye.  This test typically takes 30-45 minutes to complete.  If you have any  questions regarding your exam or if you need to reschedule, you may call the CT department at [phone] between the hours of 8:00 am and 5:00 pm, Monday-Friday.  ___________________________________________________________You are being sent next door for labs today.  Please give orders to lab tech.  Recall colonoscopy in 07/2020.  Follow up in six months.  Please call the office as the schedule is not available at this time.  Thank you,  Dr. Jackquline Denmark

## 2019-10-06 LAB — COMPREHENSIVE METABOLIC PANEL
ALT: 21 IU/L (ref 0–32)
AST: 20 IU/L (ref 0–40)
Albumin/Globulin Ratio: 2 (ref 1.2–2.2)
Albumin: 4.2 g/dL (ref 3.8–4.9)
Alkaline Phosphatase: 65 IU/L (ref 48–121)
BUN/Creatinine Ratio: 16 (ref 9–23)
BUN: 11 mg/dL (ref 6–24)
Bilirubin Total: 0.3 mg/dL (ref 0.0–1.2)
CO2: 27 mmol/L (ref 20–29)
Calcium: 9 mg/dL (ref 8.7–10.2)
Chloride: 104 mmol/L (ref 96–106)
Creatinine, Ser: 0.7 mg/dL (ref 0.57–1.00)
GFR calc Af Amer: 114 mL/min/{1.73_m2} (ref >59)
GFR calc non Af Amer: 99 mL/min/{1.73_m2} (ref >59)
Globulin, Total: 2.1 g/dL (ref 1.5–4.5)
Glucose: 90 mg/dL (ref 65–99)
Potassium: 4.4 mmol/L (ref 3.5–5.2)
Sodium: 143 mmol/L (ref 134–144)
Total Protein: 6.3 g/dL (ref 6.0–8.5)

## 2019-10-06 LAB — CBC WITH DIFFERENTIAL/PLATELET
Basophils Absolute: 0 10*3/uL (ref 0.0–0.2)
Basos: 0 %
EOS (ABSOLUTE): 0.1 10*3/uL (ref 0.0–0.4)
Eos: 3 %
Hematocrit: 44.5 % (ref 34.0–46.6)
Hemoglobin: 14.6 g/dL (ref 11.1–15.9)
Immature Grans (Abs): 0 10*3/uL (ref 0.0–0.1)
Immature Granulocytes: 0 %
Lymphocytes Absolute: 1.2 10*3/uL (ref 0.7–3.1)
Lymphs: 44 %
MCH: 31.1 pg (ref 26.6–33.0)
MCHC: 32.8 g/dL (ref 31.5–35.7)
MCV: 95 fL (ref 79–97)
Monocytes Absolute: 0.4 10*3/uL (ref 0.1–0.9)
Monocytes: 15 %
Neutrophils Absolute: 1 10*3/uL — ABNORMAL LOW (ref 1.4–7.0)
Neutrophils: 38 %
Platelets: 170 10*3/uL (ref 150–450)
RBC: 4.69 x10E6/uL (ref 3.77–5.28)
RDW: 12.1 % (ref 11.7–15.4)
WBC: 2.7 10*3/uL — ABNORMAL LOW (ref 3.4–10.8)

## 2019-10-12 ENCOUNTER — Encounter: Payer: Self-pay | Admitting: Gastroenterology

## 2019-10-18 DIAGNOSIS — Z1231 Encounter for screening mammogram for malignant neoplasm of breast: Secondary | ICD-10-CM | POA: Diagnosis not present

## 2019-10-19 ENCOUNTER — Other Ambulatory Visit: Payer: Self-pay

## 2019-10-19 ENCOUNTER — Encounter (HOSPITAL_BASED_OUTPATIENT_CLINIC_OR_DEPARTMENT_OTHER): Payer: Self-pay

## 2019-10-19 ENCOUNTER — Telehealth: Payer: Self-pay | Admitting: Gastroenterology

## 2019-10-19 ENCOUNTER — Ambulatory Visit (HOSPITAL_BASED_OUTPATIENT_CLINIC_OR_DEPARTMENT_OTHER)
Admission: RE | Admit: 2019-10-19 | Discharge: 2019-10-19 | Disposition: A | Discharge status: Home / Self Care | Payer: BC Managed Care – PPO | Source: Ambulatory Visit | Attending: Gastroenterology | Admitting: Gastroenterology

## 2019-10-19 DIAGNOSIS — R10813 Right lower quadrant abdominal tenderness: Secondary | ICD-10-CM | POA: Insufficient documentation

## 2019-10-19 DIAGNOSIS — R1032 Left lower quadrant pain: Secondary | ICD-10-CM | POA: Diagnosis not present

## 2019-10-19 MED ORDER — IOHEXOL 300 MG/ML  SOLN
100.0000 mL | Freq: Once | INTRAMUSCULAR | Status: AC | PRN
  Administered 2019-10-19: 100 mL via INTRAVENOUS

## 2019-10-19 NOTE — Telephone Encounter (Signed)
Spoke to patient who states that she got a migraine headache and nausea 45 minutes into her CT abdomin pelvis with contrast today. She drank 2 bottles at 2 and 1 hour before CT. Patient states that she is glutin intolerant and felt there may have been a glutin component in the contrast. She said it felt like the type of headache she would get after eating glutin. Patient is feeling better now. Headache and nausea has improved. She is drinking fluids and eating glutin free toast. She will also take an antihistamine.  She will drink lots of fluids the rest of the day. If her symptoms worsen,developes shortness of breath mouth or tongue or swelling she was advised to go to the ED.

## 2019-10-21 NOTE — Telephone Encounter (Signed)
LMOM for patient to call back.

## 2019-10-21 NOTE — Telephone Encounter (Signed)
Can you please check on her and see how she is doing. RG

## 2019-11-02 DIAGNOSIS — N951 Menopausal and female climacteric states: Secondary | ICD-10-CM | POA: Diagnosis not present

## 2019-11-02 DIAGNOSIS — K900 Celiac disease: Secondary | ICD-10-CM | POA: Diagnosis not present

## 2019-11-02 DIAGNOSIS — K760 Fatty (change of) liver, not elsewhere classified: Secondary | ICD-10-CM | POA: Diagnosis not present

## 2019-11-02 DIAGNOSIS — Z6821 Body mass index (BMI) 21.0-21.9, adult: Secondary | ICD-10-CM | POA: Diagnosis not present

## 2019-11-04 DIAGNOSIS — K900 Celiac disease: Secondary | ICD-10-CM | POA: Diagnosis not present

## 2019-11-04 DIAGNOSIS — G43819 Other migraine, intractable, without status migrainosus: Secondary | ICD-10-CM | POA: Diagnosis not present

## 2019-11-04 DIAGNOSIS — K5900 Constipation, unspecified: Secondary | ICD-10-CM | POA: Diagnosis not present

## 2019-11-04 DIAGNOSIS — R140 Abdominal distension (gaseous): Secondary | ICD-10-CM | POA: Diagnosis not present

## 2020-01-07 DIAGNOSIS — Z20822 Contact with and (suspected) exposure to covid-19: Secondary | ICD-10-CM | POA: Diagnosis not present

## 2020-02-08 DIAGNOSIS — E039 Hypothyroidism, unspecified: Secondary | ICD-10-CM | POA: Diagnosis not present

## 2020-07-31 DIAGNOSIS — E039 Hypothyroidism, unspecified: Secondary | ICD-10-CM | POA: Diagnosis not present

## 2020-08-15 DIAGNOSIS — M25462 Effusion, left knee: Secondary | ICD-10-CM | POA: Diagnosis not present

## 2020-08-15 DIAGNOSIS — M25562 Pain in left knee: Secondary | ICD-10-CM | POA: Diagnosis not present

## 2020-08-23 DIAGNOSIS — M25562 Pain in left knee: Secondary | ICD-10-CM | POA: Diagnosis not present

## 2020-08-23 DIAGNOSIS — M94262 Chondromalacia, left knee: Secondary | ICD-10-CM | POA: Diagnosis not present

## 2020-08-30 DIAGNOSIS — M25462 Effusion, left knee: Secondary | ICD-10-CM | POA: Diagnosis not present

## 2020-08-30 DIAGNOSIS — M25562 Pain in left knee: Secondary | ICD-10-CM | POA: Diagnosis not present

## 2020-08-30 DIAGNOSIS — M2242 Chondromalacia patellae, left knee: Secondary | ICD-10-CM | POA: Diagnosis not present

## 2020-09-10 DIAGNOSIS — N951 Menopausal and female climacteric states: Secondary | ICD-10-CM | POA: Diagnosis not present

## 2020-09-26 DIAGNOSIS — N951 Menopausal and female climacteric states: Secondary | ICD-10-CM | POA: Diagnosis not present

## 2020-11-14 DIAGNOSIS — Z131 Encounter for screening for diabetes mellitus: Secondary | ICD-10-CM | POA: Diagnosis not present

## 2020-11-14 DIAGNOSIS — Z6822 Body mass index (BMI) 22.0-22.9, adult: Secondary | ICD-10-CM | POA: Diagnosis not present

## 2020-11-14 DIAGNOSIS — Z1331 Encounter for screening for depression: Secondary | ICD-10-CM | POA: Diagnosis not present

## 2020-11-14 DIAGNOSIS — Z0000 Encounter for general adult medical examination without abnormal findings: Secondary | ICD-10-CM | POA: Diagnosis not present

## 2020-11-14 DIAGNOSIS — Z23 Encounter for immunization: Secondary | ICD-10-CM | POA: Diagnosis not present

## 2020-11-14 DIAGNOSIS — Z13220 Encounter for screening for lipoid disorders: Secondary | ICD-10-CM | POA: Diagnosis not present

## 2020-11-30 ENCOUNTER — Encounter: Payer: Self-pay | Admitting: Gastroenterology

## 2020-12-03 ENCOUNTER — Encounter: Payer: Self-pay | Admitting: Gastroenterology

## 2020-12-25 ENCOUNTER — Ambulatory Visit (AMBULATORY_SURGERY_CENTER): Payer: BC Managed Care – PPO

## 2020-12-25 VITALS — Ht 64.0 in | Wt 129.0 lb

## 2020-12-25 DIAGNOSIS — Z1211 Encounter for screening for malignant neoplasm of colon: Secondary | ICD-10-CM

## 2020-12-25 MED ORDER — CLENPIQ 10-3.5-12 MG-GM -GM/160ML PO SOLN: 1.0000 | Freq: Once | ORAL | 0 refills | Status: AC

## 2020-12-25 MED ORDER — ONDANSETRON HCL 4 MG PO TABS: 4.0000 mg | ORAL_TABLET | ORAL | 0 refills | Status: DC

## 2020-12-25 NOTE — Progress Notes (Signed)
No egg or soy allergy known to patient  No issues known to pt with past sedation with any surgeries or procedures Patient denies ever being told they had issues or difficulty with intubation  No FH of Malignant Hyperthermia Pt is not on diet pills Pt is not on  home 02  Pt is not on blood thinners  Pt denies issues with constipation  No A fib or A flutter   Virtual previsit   Clenpiq Coupon given to pt in PV today , Code to Pharmacy and  NO PA's for preps discussed with pt In PV today  Discussed with pt there will be an out-of-pocket cost for prep and that varies from $0 to 70 +  dollars - pt verbalized understanding   Due to the COVID-19 pandemic we are asking patients to follow certain guidelines in PV and the [facility]   Pt aware of COVID protocols and [facility] guidelines

## 2020-12-26 ENCOUNTER — Encounter: Payer: Self-pay | Admitting: Gastroenterology

## 2020-12-28 ENCOUNTER — Telehealth: Payer: Self-pay | Admitting: Gastroenterology

## 2020-12-28 NOTE — Telephone Encounter (Signed)
Called at 1022- no answer- LM to return call  Hilda Lias PV

## 2020-12-28 NOTE — Telephone Encounter (Signed)
Colon 01-04-2021 Pt has a sulfa based allergy of rash She has concern about sodium sulfate in her prep solution-  instructed her not the same as a Sulfa allergy and should have no issues with the sodium in the prep - she verbalized understanding

## 2020-12-28 NOTE — Telephone Encounter (Signed)
Inbound call from patient with concerns about prep medication.

## 2021-01-02 DIAGNOSIS — Z1231 Encounter for screening mammogram for malignant neoplasm of breast: Secondary | ICD-10-CM | POA: Diagnosis not present

## 2021-01-02 DIAGNOSIS — Z01419 Encounter for gynecological examination (general) (routine) without abnormal findings: Secondary | ICD-10-CM | POA: Diagnosis not present

## 2021-01-04 ENCOUNTER — Encounter: Payer: Self-pay | Admitting: Gastroenterology

## 2021-01-04 ENCOUNTER — Other Ambulatory Visit: Payer: Self-pay

## 2021-01-04 ENCOUNTER — Ambulatory Visit (AMBULATORY_SURGERY_CENTER): Payer: BC Managed Care – PPO | Admitting: Gastroenterology

## 2021-01-04 VITALS — BP 115/45 | HR 88 | Temp 97.5°F | Resp 12 | Ht 64.0 in | Wt 129.0 lb

## 2021-01-04 DIAGNOSIS — R10813 Right lower quadrant abdominal tenderness: Secondary | ICD-10-CM

## 2021-01-04 DIAGNOSIS — Z1211 Encounter for screening for malignant neoplasm of colon: Secondary | ICD-10-CM

## 2021-01-04 MED ORDER — SODIUM CHLORIDE 0.9 % IV SOLN: 500.0000 mL | Freq: Once | INTRAVENOUS | Status: DC

## 2021-01-04 NOTE — Progress Notes (Signed)
PT taken to PACU. Monitors in place. VSS. Report given to RN. 

## 2021-01-04 NOTE — Patient Instructions (Signed)

## 2021-01-04 NOTE — Progress Notes (Signed)
Chief Complaint:   Referring Provider: Dr. Maryjean Ka at [facility]      ASSESSMENT AND PLAN;   For screening colonoscopy. RLQ abdominal pain IBS-C   Plan: Colonoscopy    HPI:    Terri Matthews is a 55 y.o. female   For colon   SH- Moved from [place], [place]. Married  Past surgical history: -Appendicectomy -History of kidney stones -Partial hysterectomy with partial small bowel resection 2011 due to endometriosis. -Liver biopsy 06/11/2009 showing mild steatosis Past Medical History:  Diagnosis Date   Allergy    gluten severe reaction   Diaphragmatic hernia without obstruction or gangrene    Diverticulosis    GERD (gastroesophageal reflux disease)    Sullivan Lone syndrome    Gluten intolerance 2010   Hypothyroidism    IBS (irritable bowel syndrome)    NASH (nonalcoholic steatohepatitis)    Past Surgical History:  Procedure Laterality Date   Abdominal laprascopy     x3 for endometriosis   APPENDECTOMY     COLONOSCOPY  08/01/2010   ESOPHAGOGASTRODUODENOSCOPY  06/02/2013   Minimal transient hiatal hernia. Mild gastrtiis. Otherwise normal EGD.    Ganglion cysts removal     KIDNEY STONE SURGERY  2005   PARTIAL HYSTERECTOMY     UPPER GASTROINTESTINAL ENDOSCOPY      Family History  Problem Relation Age of Onset   Colon polyps Mother    Liver cancer Paternal Aunt        stage 4 liver cancer    Colon cancer Neg Hx    Esophageal cancer Neg Hx    Rectal cancer Neg Hx    Stomach cancer Neg Hx     Social History   Tobacco Use   Smoking status: Never   Smokeless tobacco: Never  Vaping Use   Vaping Use: Never used  Substance Use Topics   Alcohol use: Yes    Comment: may have 2 glasses a month, cocktail or wine, rarely   Drug use: Never    Current Outpatient Medications  Medication Sig Dispense Refill   magnesium 30 MG tablet Take 30 mg by mouth daily.     Multiple Vitamin (MULTIVITAMIN) tablet Take 1 tablet by mouth  daily.     Omega-3 Fatty Acids (FISH OIL BURP-LESS PO) Take by mouth.     Polyvinyl Alcohol-Povidone (REFRESH OP) Apply to eye.     thyroid (ARMOUR) 30 MG tablet Take by mouth daily.     White Petrolatum-Mineral Oil (ARTIFICIAL TEARS) ointment daily.     acidophilus (RISAQUAD) CAPS capsule Take by mouth daily. (Patient not taking: Reported on 01/04/2021)     ondansetron (ZOFRAN) 4 MG tablet Take 1 tablet (4 mg total) by mouth as directed for 2 doses. Take 1 -4 mg Zofran 30-60 minutes before the prep dose the day before the colonoscopy Take 1-4 mg Zofran 30-60 minutes before the prep dose the day of the colonoscopy (Patient not taking: Reported on 01/04/2021) 2 tablet 0   rizatriptan (MAXALT) 10 MG tablet TAKE 1 TABLET BY MOUTH AT THE ONSET OF A MIGRAINE (Patient not taking: Reported on 01/04/2021)     Current Facility-Administered Medications  Medication Dose Route Frequency Provider Last Rate Last Admin   0.9 %  sodium chloride infusion  500 mL Intravenous Once Lynann Bologna, MD        Allergies  Allergen Reactions   Erythromycin Hives    Upset stomach    Other Other (See Comments)    Severe  Gluten intolerance and reaction, liver inflammation and scarring, Severe abominal pain and spasms, migraine,    Sulfa Antibiotics    Sulfamethoxazole-Trimethoprim Other (See Comments)    Unknown    Review of Systems:  neg     Physical Exam:    BP 99/62   Pulse 69   Temp (!) 97.5 F (36.4 C) (Skin)   Ht 5\' 4"  (1.626 m)   Wt 129 lb (58.5 kg)   SpO2 100%   BMI 22.14 kg/m  Filed Weights   01/04/21 1301  Weight: 129 lb (58.5 kg)  Gen: awake, alert, NAD HEENT: anicteric, no pallor CV: RRR, no mrg Pulm: CTA b/l Abd: soft, RLQ tenderness without rebound, +BS throughout Ext: no c/c/e Neuro: nonfocal   13/11/22, MD 01/04/2021, 1:24 PM  Cc: Dr 13/12/2020.

## 2021-01-04 NOTE — Progress Notes (Signed)
Pt's states no medical or surgical changes since previsit or office visit. VS assessed by D.T 

## 2021-01-04 NOTE — Op Note (Signed)
[facility] Patient Name: Terri Matthews Procedure Date: 01/04/2021 1:20 PM MRN: [id number] Endoscopist: Lynann Bologna , MD Age: 55 Referring MD:  Date of Birth: 1965-12-20 Gender: Female Account #: [id number] Procedure:                Colonoscopy Indications:              Screening for colorectal malignant neoplasm Medicines:                Monitored Anesthesia Care Procedure:                Pre-Anesthesia Assessment:                           - Prior to the procedure, a History and Physical                            was performed, and patient medications and                            allergies were reviewed. The patient's tolerance of                            previous anesthesia was also reviewed. The risks                            and benefits of the procedure and the sedation                            options and risks were discussed with the patient.                            All questions were answered, and informed consent                            was obtained. Prior Anticoagulants: The patient has                            taken no previous anticoagulant or antiplatelet                            agents. ASA Grade Assessment: II - A patient with                            mild systemic disease. After reviewing the risks                            and benefits, the patient was deemed in                            satisfactory condition to undergo the procedure.                           After obtaining informed consent, the colonoscope  was passed under direct vision. Throughout the                            procedure, the patient's blood pressure, pulse, and                            oxygen saturations were monitored continuously. The                            PCF-HQ190L Colonoscope was introduced through the                            anus and advanced to the 2 cm into the ileum. The                            colonoscopy was  performed without difficulty. The                            patient tolerated the procedure well. The quality                            of the bowel preparation was good. The ileocecal                            valve, appendiceal orifice, and rectum were                            photographed. Scope In: 1:32:17 PM Scope Out: 1:47:20 PM Scope Withdrawal Time: 0 hours 8 minutes 50 seconds  Total Procedure Duration: 0 hours 15 minutes 3 seconds  Findings:                 A few medium-mouthed diverticula were found in the                            sigmoid colon and ascending colon.                           Non-bleeding internal hemorrhoids were found during                            retroflexion. The hemorrhoids were small and Grade                            I (internal hemorrhoids that do not prolapse).                           The exam was otherwise without abnormality on                            direct and retroflexion views. The colon was highly                            redundant. Complications:  No immediate complications. Estimated Blood Loss:     Estimated blood loss: none. Impression:               - Mild pancolonic diverticulosis.                           - Non-bleeding internal hemorrhoids.                           - The examination was otherwise normal on direct                            and retroflexion views.                           - No specimens collected. Recommendation:           - Patient has a contact number available for                            emergencies. The signs and symptoms of potential                            delayed complications were discussed with the                            patient. Return to normal activities tomorrow.                            Written discharge instructions were provided to the                            patient.                           - Resume previous diet.                           - Continue  present medications.                           - Repeat colonoscopy in 10 years for screening                            purposes. Earlier, if with any new problems or                            change in family history.                           - Return to GI clinic PRN.                           - The findings and recommendations were discussed                            with the patient's family. Lynann Bologna, MD 01/04/2021 1:51:22  PM This report has been signed electronically.

## 2021-01-08 ENCOUNTER — Telehealth: Payer: Self-pay

## 2021-01-08 NOTE — Telephone Encounter (Signed)
  Follow up Call-  Call back number 01/04/2021  Post procedure Call Back phone  # [phone]  Permission to leave phone message Yes  Some recent data might be hidden     Patient questions:  Do you have a fever, pain , or abdominal swelling? Yes.   Pain Score  4 *  Have you tolerated food without any problems? Yes.    Have you been able to return to your normal activities? Yes.    Do you have any questions about your discharge instructions: Diet   No. Medications  No. Follow up visit  No.  Do you have questions or concerns about your Care? Yes.    Patient is complaining of large external hemorrhoids after the procedure. She states that they are itchy, painful and swollen. Has been using hydrocortisone cream with some relief of symptoms but says that they are still swollen. Wants to no if it is okay to keep using this and if you have any other recommendations. Also states that she has not had any bowel movements since the procedure. Had some cramping last night, but other than that no symptoms. Wants to know what she should do about this.    Actions: * If pain score is 4 or above: Physician/ provider Notified : Lynann Bologna, MD.

## 2021-01-08 NOTE — Telephone Encounter (Signed)
-----   Message from Rosine Door, RN sent at 01/08/2021  1:04 PM EST ----- Regarding: follow up call Patient is really concerned about the hemorrhoids at this point. Do you have any recommendations for her? Thanks.

## 2021-01-08 NOTE — Telephone Encounter (Signed)
If still with abdo pain, Bentyl 10 mg p.o. twice daily x 2 weeks. NR If still with abdominal pain, then would get x-ray KUB-supine/upright.  At this time, would like to hold off since there is no fever and she is able to tolerate p.o.  RG

## 2021-01-08 NOTE — Telephone Encounter (Signed)
Pt was notified that this is common sometimes after a Colonoscopy to experience some external hemorrhoids  with all the prep and multiple BM involved in that. Pt was encouraged to use wet wipes, sitz bath, Preparation H, Hydrocortisone cream  Pt verbalized understanding with all questions answered.

## 2021-01-11 ENCOUNTER — Encounter: Payer: Self-pay | Admitting: Gastroenterology

## 2021-01-11 NOTE — Telephone Encounter (Signed)
Patient called requested to ONLY speak with Dr. Chales Abrahams regarding her after effect or symptoms since her procedure.

## 2021-01-11 NOTE — Telephone Encounter (Signed)
Please see note below. 

## 2021-01-15 DIAGNOSIS — Z1231 Encounter for screening mammogram for malignant neoplasm of breast: Secondary | ICD-10-CM | POA: Diagnosis not present

## 2021-01-15 DIAGNOSIS — Z23 Encounter for immunization: Secondary | ICD-10-CM | POA: Diagnosis not present

## 2021-01-15 DIAGNOSIS — N898 Other specified noninflammatory disorders of vagina: Secondary | ICD-10-CM | POA: Diagnosis not present

## 2021-01-21 NOTE — Telephone Encounter (Signed)
Pt stated that she is feeling much better. Pt given Dr. Chales Abrahams Recommendations. Pt verbalized understanding with all questions

## 2021-01-21 NOTE — Telephone Encounter (Signed)
Tried calling. Left VM Should be better. If still with problems, sitx baths BID x 4-5 days and let me know (pl text me) RG

## 2021-01-21 NOTE — Telephone Encounter (Signed)
Called. Left VM RG

## 2021-02-10 DIAGNOSIS — J0110 Acute frontal sinusitis, unspecified: Secondary | ICD-10-CM | POA: Diagnosis not present

## 2021-02-10 DIAGNOSIS — Z20822 Contact with and (suspected) exposure to covid-19: Secondary | ICD-10-CM | POA: Diagnosis not present

## 2021-02-27 DIAGNOSIS — J40 Bronchitis, not specified as acute or chronic: Secondary | ICD-10-CM | POA: Diagnosis not present

## 2021-02-27 DIAGNOSIS — J329 Chronic sinusitis, unspecified: Secondary | ICD-10-CM | POA: Diagnosis not present

## 2021-02-27 DIAGNOSIS — Z20828 Contact with and (suspected) exposure to other viral communicable diseases: Secondary | ICD-10-CM | POA: Diagnosis not present

## 2021-03-05 DIAGNOSIS — E039 Hypothyroidism, unspecified: Secondary | ICD-10-CM | POA: Diagnosis not present

## 2021-07-08 DIAGNOSIS — Z6821 Body mass index (BMI) 21.0-21.9, adult: Secondary | ICD-10-CM | POA: Diagnosis not present

## 2021-07-08 DIAGNOSIS — G5711 Meralgia paresthetica, right lower limb: Secondary | ICD-10-CM | POA: Diagnosis not present

## 2021-07-08 DIAGNOSIS — S39012A Strain of muscle, fascia and tendon of lower back, initial encounter: Secondary | ICD-10-CM | POA: Diagnosis not present

## 2021-07-17 DIAGNOSIS — L578 Other skin changes due to chronic exposure to nonionizing radiation: Secondary | ICD-10-CM | POA: Diagnosis not present

## 2021-07-17 DIAGNOSIS — L814 Other melanin hyperpigmentation: Secondary | ICD-10-CM | POA: Diagnosis not present

## 2021-07-17 DIAGNOSIS — D485 Neoplasm of uncertain behavior of skin: Secondary | ICD-10-CM | POA: Diagnosis not present

## 2021-07-17 DIAGNOSIS — D225 Melanocytic nevi of trunk: Secondary | ICD-10-CM | POA: Diagnosis not present

## 2021-07-17 DIAGNOSIS — L820 Inflamed seborrheic keratosis: Secondary | ICD-10-CM | POA: Diagnosis not present

## 2021-08-16 DIAGNOSIS — R140 Abdominal distension (gaseous): Secondary | ICD-10-CM | POA: Diagnosis not present

## 2021-08-16 DIAGNOSIS — Z1231 Encounter for screening mammogram for malignant neoplasm of breast: Secondary | ICD-10-CM | POA: Diagnosis not present

## 2021-08-16 DIAGNOSIS — Z01419 Encounter for gynecological examination (general) (routine) without abnormal findings: Secondary | ICD-10-CM | POA: Diagnosis not present

## 2021-08-16 DIAGNOSIS — Z1151 Encounter for screening for human papillomavirus (HPV): Secondary | ICD-10-CM | POA: Diagnosis not present

## 2021-08-23 DIAGNOSIS — R140 Abdominal distension (gaseous): Secondary | ICD-10-CM | POA: Diagnosis not present

## 2021-09-16 DIAGNOSIS — M25562 Pain in left knee: Secondary | ICD-10-CM | POA: Diagnosis not present

## 2021-10-02 DIAGNOSIS — E039 Hypothyroidism, unspecified: Secondary | ICD-10-CM | POA: Diagnosis not present

## 2021-12-17 ENCOUNTER — Encounter: Payer: Self-pay | Admitting: Gastroenterology

## 2021-12-17 ENCOUNTER — Ambulatory Visit: Payer: BC Managed Care – PPO | Admitting: Gastroenterology

## 2021-12-17 ENCOUNTER — Other Ambulatory Visit (INDEPENDENT_AMBULATORY_CARE_PROVIDER_SITE_OTHER): Payer: BC Managed Care – PPO

## 2021-12-17 VITALS — BP 119/60 | HR 79 | Ht 64.0 in | Wt 127.0 lb

## 2021-12-17 DIAGNOSIS — K581 Irritable bowel syndrome with constipation: Secondary | ICD-10-CM

## 2021-12-17 DIAGNOSIS — R140 Abdominal distension (gaseous): Secondary | ICD-10-CM | POA: Diagnosis not present

## 2021-12-17 DIAGNOSIS — K760 Fatty (change of) liver, not elsewhere classified: Secondary | ICD-10-CM

## 2021-12-17 DIAGNOSIS — K9041 Non-celiac gluten sensitivity: Secondary | ICD-10-CM

## 2021-12-17 LAB — CBC WITH DIFFERENTIAL/PLATELET
Basophils Absolute: 0 10*3/uL (ref 0.0–0.1)
Basophils Relative: 0.8 % (ref 0.0–3.0)
Eosinophils Absolute: 0.2 10*3/uL (ref 0.0–0.7)
Eosinophils Relative: 2.7 % (ref 0.0–5.0)
HCT: 44.7 % (ref 36.0–46.0)
Hemoglobin: 14.5 g/dL (ref 12.0–15.0)
Lymphocytes Relative: 36.3 % (ref 12.0–46.0)
Lymphs Abs: 2.1 10*3/uL (ref 0.7–4.0)
MCHC: 32.5 g/dL (ref 30.0–36.0)
MCV: 95.4 fl (ref 78.0–100.0)
Monocytes Absolute: 0.5 10*3/uL (ref 0.1–1.0)
Monocytes Relative: 8.1 % (ref 3.0–12.0)
Neutro Abs: 3 10*3/uL (ref 1.4–7.7)
Neutrophils Relative %: 52.1 % (ref 43.0–77.0)
Platelets: 224 10*3/uL (ref 150.0–400.0)
RBC: 4.69 Mil/uL (ref 3.87–5.11)
RDW: 13.5 % (ref 11.5–15.5)
WBC: 5.8 10*3/uL (ref 4.0–10.5)

## 2021-12-17 LAB — COMPREHENSIVE METABOLIC PANEL
ALT: 19 U/L (ref 0–35)
AST: 18 U/L (ref 0–37)
Albumin: 4.4 g/dL (ref 3.5–5.2)
Alkaline Phosphatase: 57 U/L (ref 39–117)
BUN: 14 mg/dL (ref 6–23)
CO2: 31 mEq/L (ref 19–32)
Calcium: 9.2 mg/dL (ref 8.4–10.5)
Chloride: 103 mEq/L (ref 96–112)
Creatinine, Ser: 0.76 mg/dL (ref 0.40–1.20)
GFR: 87.92 mL/min (ref >60.00)
Glucose, Bld: 91 mg/dL (ref 70–99)
Potassium: 4 mEq/L (ref 3.5–5.1)
Sodium: 139 mEq/L (ref 135–145)
Total Bilirubin: 0.8 mg/dL (ref 0.2–1.2)
Total Protein: 7 g/dL (ref 6.0–8.3)

## 2021-12-17 LAB — PROTIME-INR
INR: 1 ratio (ref 0.8–1.0)
Prothrombin Time: 10.7 s (ref 9.6–13.1)

## 2021-12-17 IMAGING — CT CT ABD-PELV W/ CM
2 of 5 series · 16 of 46 positions shown, 18 images · IV contrast (Omnipaque)
Comparison: CT the abdomen and pelvis 07/25/2014.

CLINICAL DATA: 53-year-old female with history of left lower
quadrant abdominal pain for the past 4 months.

EXAM:
CT ABDOMEN AND PELVIS WITH CONTRAST
TECHNIQUE: Multidetector CT imaging of the abdomen and pelvis was performed
using the standard protocol following bolus administration of
intravenous contrast.
CONTRAST:  100mL OMNIPAQUE IOHEXOL 300 MG/ML  SOLN

[Series 2: axial st · axial · 0.88mm/px · z∈[-454,-64]mm · 13 of 88 slices shown, 15 images]
[im 5/88  soft-tissue]
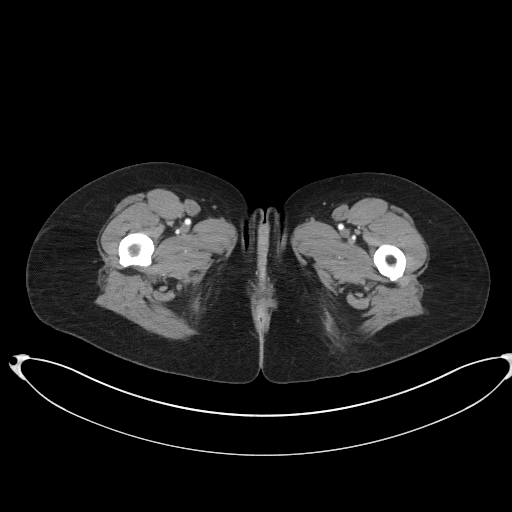
[im 5/88  bone]
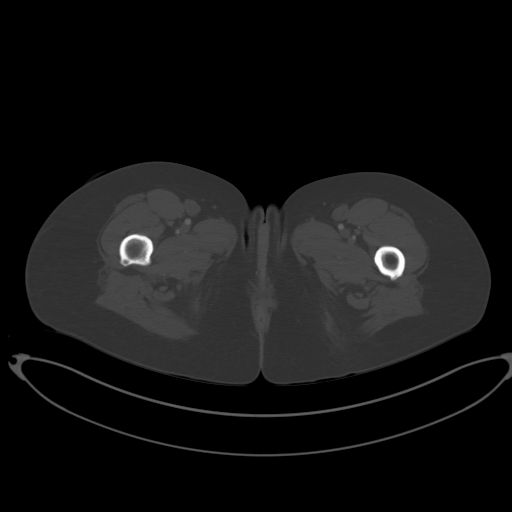
[im 14/88  soft-tissue]
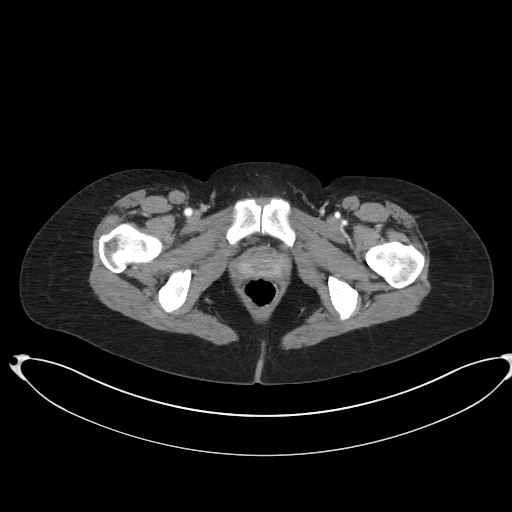
[im 19/88  soft-tissue]
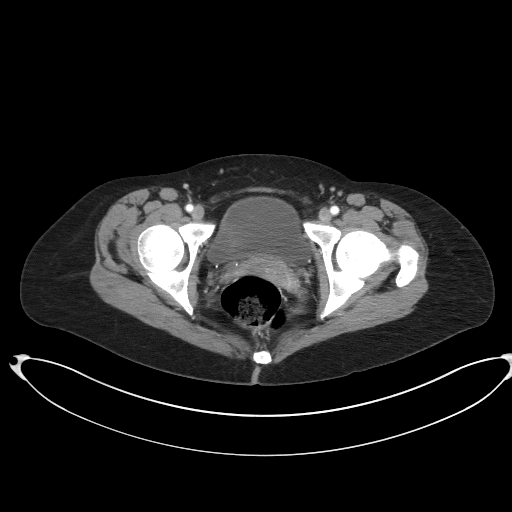
[im 23/88  soft-tissue]
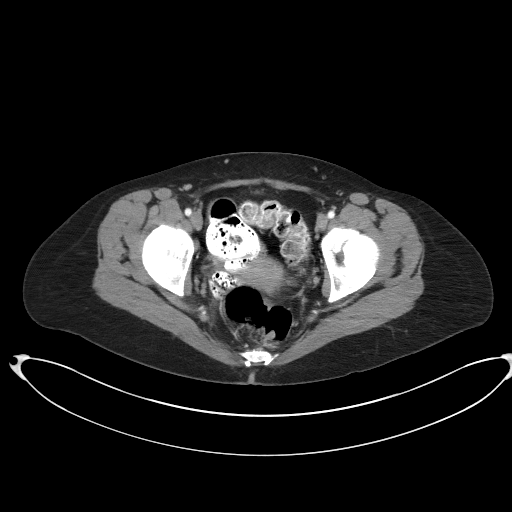
[im 33/88  soft-tissue]
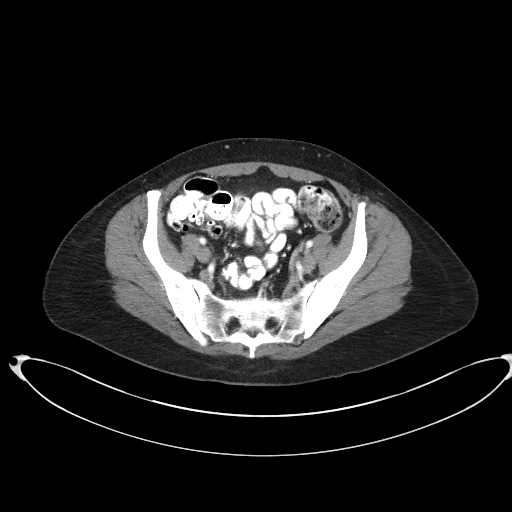
[im 37/88  soft-tissue]
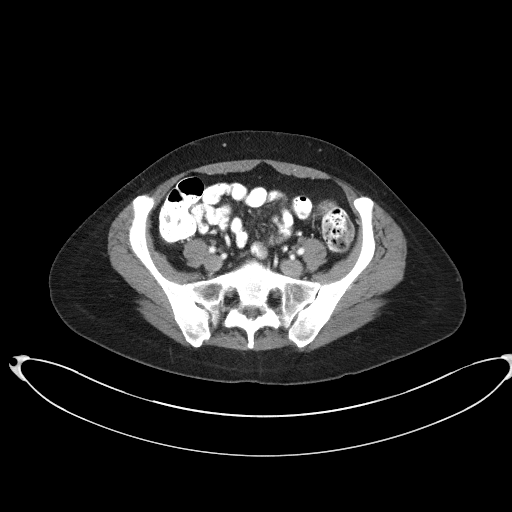
[im 46/88  soft-tissue]
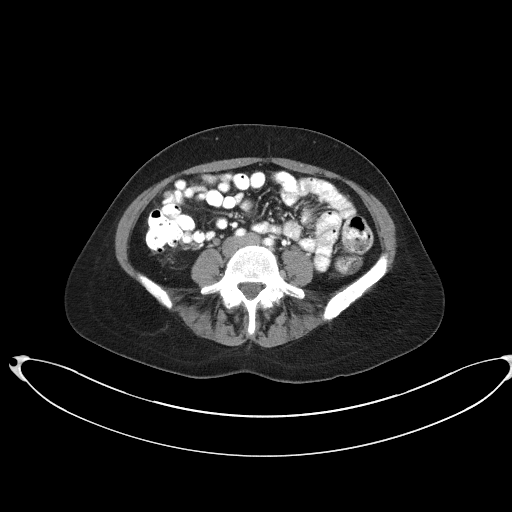
[im 51/88  soft-tissue]
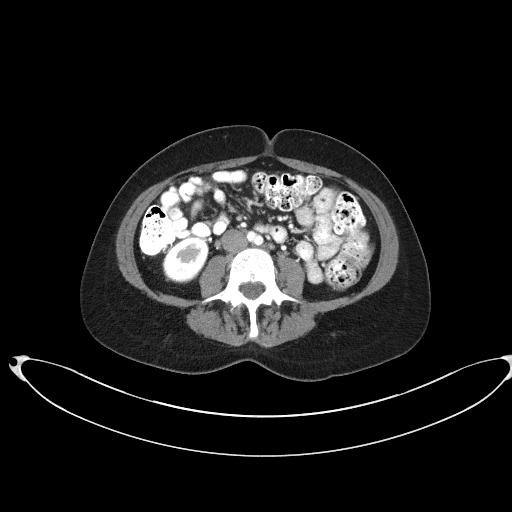
[im 55/88  soft-tissue]
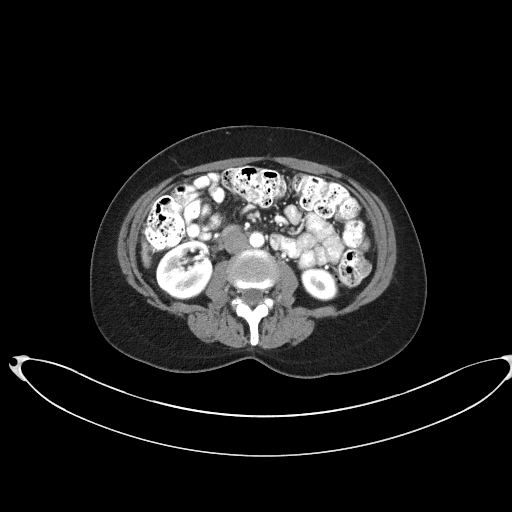
[im 55/88  bone]
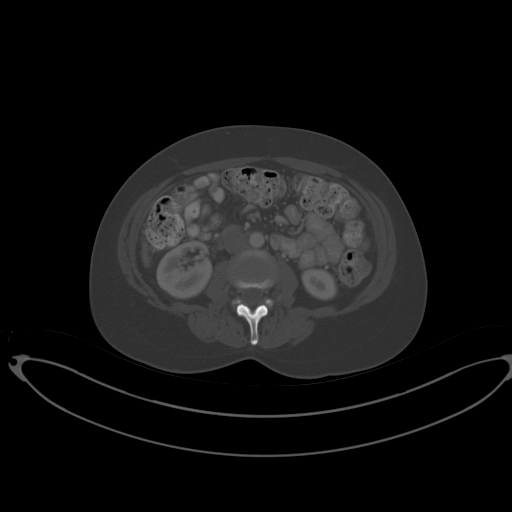
[im 65/88  soft-tissue]
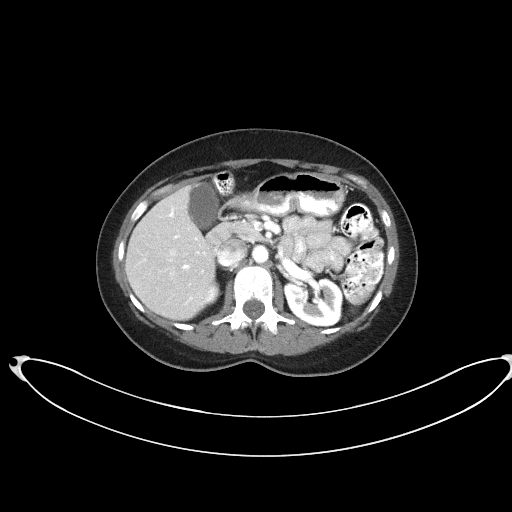
[im 69/88  soft-tissue]
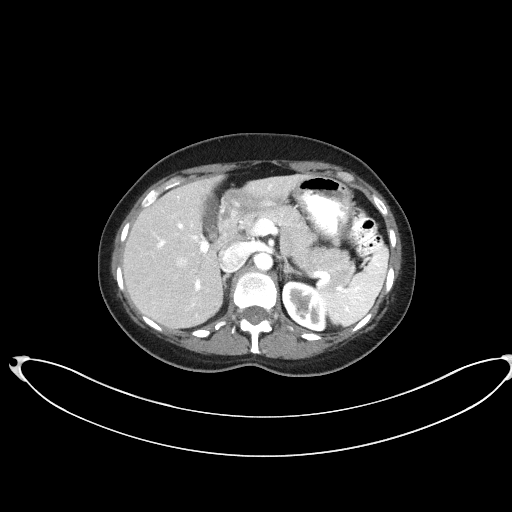
[im 74/88  soft-tissue]
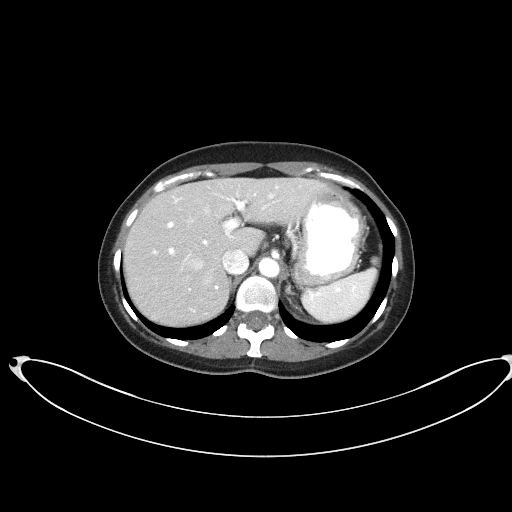
[im 83/88  soft-tissue]
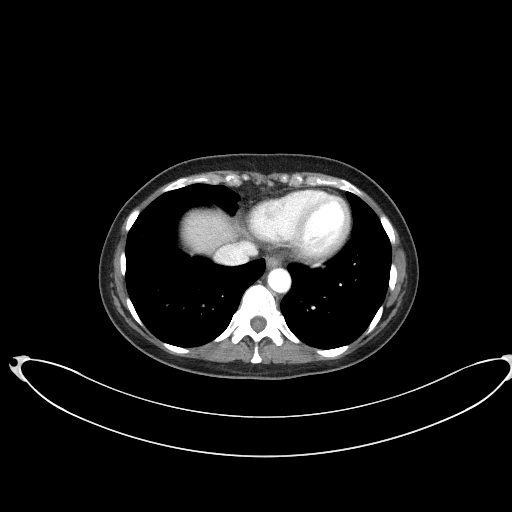

[Series 5: coronal st · coronal · 0.82mm/px · 3 of 77 slices shown]
[im 26/77  soft-tissue]
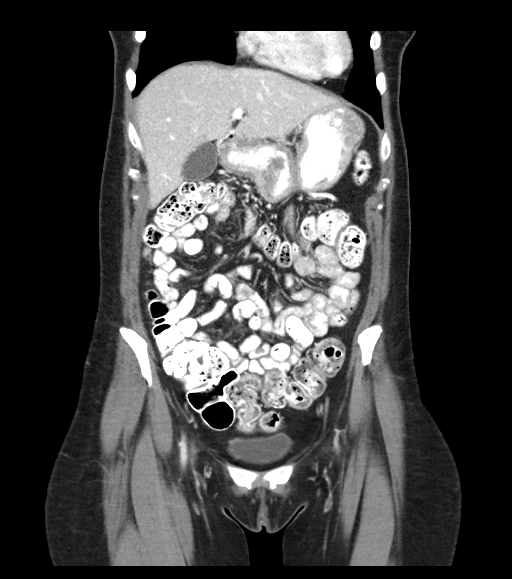
[im 34/77  soft-tissue]
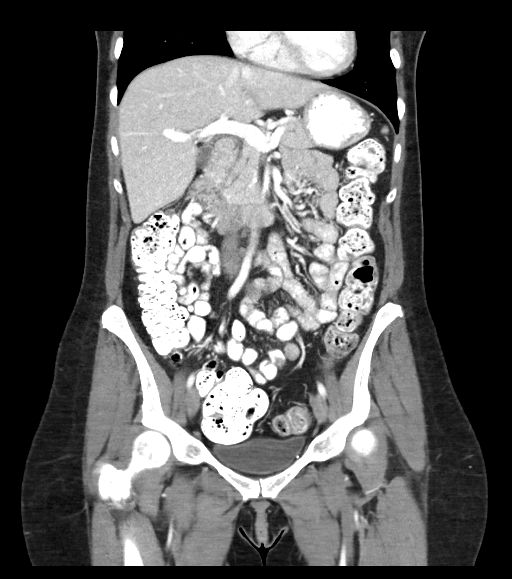
[im 43/77  soft-tissue]
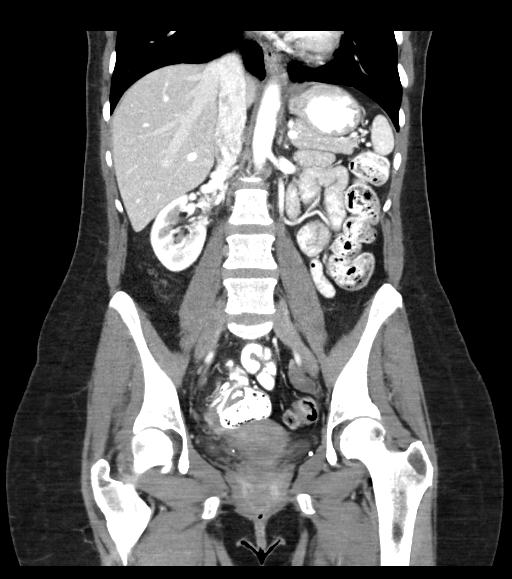

[16 of 46 positions shown; findings below may reference images not displayed]

FINDINGS: Lower chest: Unremarkable.

Hepatobiliary: No suspicious cystic or solid hepatic lesions. No
intra or extrahepatic biliary ductal dilatation. Gallbladder is
normal in appearance.

Pancreas: No pancreatic mass. No pancreatic ductal dilatation. No
pancreatic or peripancreatic fluid collections or inflammatory
changes.

Spleen: Unremarkable.

Adrenals/Urinary Tract: Bilateral kidneys and adrenal glands are
normal in appearance. No hydroureteronephrosis. Urinary bladder is
normal in appearance.

Stomach/Bowel: Stomach is normal in appearance. No pathologic
dilatation of small bowel or colon. The appendix is not confidently
identified and may be surgically absent. Regardless, there are no
inflammatory changes noted adjacent to the cecum to suggest the
presence of an acute appendicitis at this time.

Vascular/Lymphatic: No significant atherosclerotic disease, aneurysm
or dissection noted in the abdominal or pelvic vasculature. No
lymphadenopathy noted in the abdomen or pelvis.

Reproductive: Uterus and ovaries are unremarkable in appearance.

Other: No significant volume of ascites.  No pneumoperitoneum.

Musculoskeletal: There are no aggressive appearing lytic or blastic
lesions noted in the visualized portions of the skeleton.
IMPRESSION: 1. No acute findings are noted in the abdomen or pelvis to account
for the patient's symptoms. Specifically, no evidence of significant
diverticular disease or diverticulitis.

## 2021-12-17 NOTE — Progress Notes (Signed)
Chief Complaint: FU  Referring Provider: Dr. Maryjean Ka at [facility]      ASSESSMENT AND PLAN;   #1. NAFLD (on Liver Bx 05/2009-showing capsular fibrosis, mild NASH, no cirrhosis.). Neg acute viral hepatitis panel, autoimmune work-up, iron studies, ATP7B for Wilson's disease, celiac screen, A1AT. [place] neg 09/15/2018, CT 09/2019. Nl LFTs #2.  Non-celiac gluten sensitivity #3.  IBS-C with Abdo bloating.  Diet controlled. Nl recent TSH. Neg colonoscopy Nov 2022.  Plan: - Check CBC, CMP, PT INR - USE - Continue current gluten/soy/lactose free diet. - FU in 1 year   HPI:    Terri Matthews is a 56 y.o. female   FU  Doing very well from GI standpoint.  She is on gluten-free, soy free and lactose-free diet.  No significant abdominal bloating as long as she sticks to the diet.  NO constipation as long as she keeps up with her water intake and salads.  No diarrhea.  She has been moving her bowels once or twice per day.  No melena or hematochezia.  No Blood in the stool or urine.  Recently came back from a trip to [place]-saw all 5 national [address]- Moved from [place], [place]. Married  Past surgical history: -Appendicectomy -History of kidney stones -Partial hysterectomy with partial small bowel resection 2011 due to endometriosis. -Liver biopsy 06/11/2009 showing mild steatosis   Past GI recent work-up: CT Abdo/pelvis with contrast 09/2019 1. No acute findings are noted in the abdomen or pelvis to account for the patient's symptoms. Specifically, no evidence of significant diverticular disease or diverticulitis.  [place] 08/2018 Possible small nonobstructing left midpole renal stone versus artifact. No acute findings in the abdomen.  Colonoscopy November 2022 - Mild pancolonic diverticulosis. - Non-bleeding internal hemorrhoids. - The examination was otherwise normal on direct and retroflexion views. - No specimens collected. - Rpt 10 yrs.  Earlier, if with any new problems or change in family history Past Medical History:  Diagnosis Date   Allergy    gluten severe reaction   Diaphragmatic hernia without obstruction or gangrene    Diverticulosis    GERD (gastroesophageal reflux disease)    Sullivan Lone syndrome    Gluten intolerance 2010   Hypothyroidism    IBS (irritable bowel syndrome)    NASH (nonalcoholic steatohepatitis)    Past Surgical History:  Procedure Laterality Date   Abdominal laprascopy     x3 for endometriosis   APPENDECTOMY     COLONOSCOPY  08/01/2010   ESOPHAGOGASTRODUODENOSCOPY  06/02/2013   Minimal transient hiatal hernia. Mild gastrtiis. Otherwise normal EGD.    Ganglion cysts removal     KIDNEY STONE SURGERY  2005   PARTIAL HYSTERECTOMY     UPPER GASTROINTESTINAL ENDOSCOPY      Family History  Problem Relation Age of Onset   Colon polyps Mother    Liver cancer Paternal Aunt        stage 4 liver cancer    Colon cancer Neg Hx    Esophageal cancer Neg Hx    Rectal cancer Neg Hx    Stomach cancer Neg Hx     Social History   Tobacco Use   Smoking status: Never   Smokeless tobacco: Never  Vaping Use   Vaping Use: Never used  Substance Use Topics   Alcohol use: Yes    Comment: may have 2 glasses a month, cocktail or wine, rarely   Drug use: Never    Current Outpatient Medications  Medication Sig Dispense Refill   magnesium 30 MG tablet Take 30 mg by mouth daily.     Multiple Vitamin (MULTIVITAMIN) tablet Take 1 tablet by mouth daily.     Polyvinyl Alcohol-Povidone (REFRESH OP) Apply to eye.     rizatriptan (MAXALT) 10 MG tablet      thyroid (ARMOUR) 30 MG tablet Take by mouth daily.     White Petrolatum-Mineral Oil (ARTIFICIAL TEARS) ointment daily.     No current facility-administered medications for this visit.    Allergies  Allergen Reactions   Erythromycin Hives    Upset stomach    Other Other (See Comments)    Severe Gluten intolerance and reaction, liver inflammation  and scarring, Severe abominal pain and spasms, migraine,    Sulfa Antibiotics    Sulfamethoxazole-Trimethoprim Other (See Comments)    Unknown    Review of Systems:  neg     Physical Exam:    BP 119/60   Pulse 79   Ht 5\' 4"  (1.626 m)   Wt 127 lb (57.6 kg)   BMI 21.80 kg/m  Filed Weights   12/17/21 1526  Weight: 127 lb (57.6 kg)  Gen: awake, alert, NAD HEENT: anicteric, no pallor CV: RRR, no mrg Pulm: CTA b/l Abd: soft, RLQ tenderness without rebound, +BS throughout Ext: no c/c/e Neuro: nonfocal   Carmell Austria, MD 12/17/2021, 3:37 PM  Cc: Dr Venetia Maxon.

## 2021-12-17 NOTE — Patient Instructions (Addendum)
_______________________________________________________  If you are age 56 or older, your body mass index should be between 23-30. Your Body mass index is 21.8 kg/m. If this is out of the aforementioned range listed, please consider follow up with your Primary Care Provider.  If you are age 77 or younger, your body mass index should be between 19-25. Your Body mass index is 21.8 kg/m. If this is out of the aformentioned range listed, please consider follow up with your Primary Care Provider.   ________________________________________________________  The [place] [facility] providers would like to encourage you to use [facility] to communicate with providers for non-urgent requests or questions.  Due to long hold times on the telephone, sending your provider a message by [facility] may be a faster and more efficient way to get a response.  Please allow 48 business hours for a response.  Please remember that this is for non-urgent requests.  _______________________________________________________  Your provider has requested that you go to the basement level for lab work before leaving today. Press "B" on the elevator. The lab is located at the first door on the left as you exit the elevator.  Continue gluten/soy and lactose free diet  You have been scheduled for an abdominal ultrasound with elastrography at [facility] (1st floor of hospital) on 12-26-2021 at 9am. Please arrive 15 minutes prior to your appointment for registration. Make certain not to have anything to eat or drink midnight  prior to your appointment. Should you need to reschedule your appointment, please contact radiology at [phone].   Please follow up in 12 months. Give [place] a call at [phone] to schedule an appointment.  Thank you,  Dr. Jackquline Denmark

## 2021-12-26 ENCOUNTER — Ambulatory Visit (HOSPITAL_COMMUNITY)
Admission: RE | Admit: 2021-12-26 | Discharge: 2021-12-26 | Disposition: A | Discharge status: Home / Self Care | Payer: BC Managed Care – PPO | Source: Ambulatory Visit | Attending: Gastroenterology | Admitting: Gastroenterology

## 2021-12-26 DIAGNOSIS — K9041 Non-celiac gluten sensitivity: Secondary | ICD-10-CM | POA: Insufficient documentation

## 2021-12-26 DIAGNOSIS — K581 Irritable bowel syndrome with constipation: Secondary | ICD-10-CM | POA: Insufficient documentation

## 2021-12-26 DIAGNOSIS — R140 Abdominal distension (gaseous): Secondary | ICD-10-CM | POA: Insufficient documentation

## 2021-12-26 DIAGNOSIS — K760 Fatty (change of) liver, not elsewhere classified: Secondary | ICD-10-CM | POA: Insufficient documentation

## 2021-12-30 ENCOUNTER — Encounter: Payer: Self-pay | Admitting: Gastroenterology

## 2021-12-30 NOTE — Telephone Encounter (Signed)
Mild NAFLD. No cirrhosis. Just need to monitor wt, LFTs every year RG

## 2022-01-06 DIAGNOSIS — F4320 Adjustment disorder, unspecified: Secondary | ICD-10-CM | POA: Diagnosis not present

## 2022-01-07 DIAGNOSIS — K900 Celiac disease: Secondary | ICD-10-CM | POA: Diagnosis not present

## 2022-01-07 DIAGNOSIS — N951 Menopausal and female climacteric states: Secondary | ICD-10-CM | POA: Diagnosis not present

## 2022-01-07 DIAGNOSIS — Z6821 Body mass index (BMI) 21.0-21.9, adult: Secondary | ICD-10-CM | POA: Diagnosis not present

## 2022-01-07 DIAGNOSIS — Z1589 Genetic susceptibility to other disease: Secondary | ICD-10-CM | POA: Diagnosis not present

## 2022-01-10 DIAGNOSIS — K900 Celiac disease: Secondary | ICD-10-CM | POA: Diagnosis not present

## 2022-01-23 DIAGNOSIS — G253 Myoclonus: Secondary | ICD-10-CM | POA: Diagnosis not present

## 2022-02-03 DIAGNOSIS — M25552 Pain in left hip: Secondary | ICD-10-CM | POA: Diagnosis not present

## 2022-02-03 DIAGNOSIS — R102 Pelvic and perineal pain: Secondary | ICD-10-CM | POA: Diagnosis not present

## 2022-02-03 DIAGNOSIS — N952 Postmenopausal atrophic vaginitis: Secondary | ICD-10-CM | POA: Diagnosis not present

## 2022-02-03 DIAGNOSIS — N941 Unspecified dyspareunia: Secondary | ICD-10-CM | POA: Diagnosis not present

## 2022-02-04 ENCOUNTER — Ambulatory Visit: Payer: BC Managed Care – PPO | Admitting: Gastroenterology

## 2022-02-04 ENCOUNTER — Encounter: Payer: Self-pay | Admitting: Gastroenterology

## 2022-02-04 VITALS — BP 112/70 | HR 65 | Ht 64.0 in | Wt 129.8 lb

## 2022-02-04 DIAGNOSIS — K760 Fatty (change of) liver, not elsewhere classified: Secondary | ICD-10-CM | POA: Diagnosis not present

## 2022-02-04 NOTE — Patient Instructions (Signed)
_______________________________________________________  If you are age 56 or older, your body mass index should be between 23-30. Your Body mass index is 22.28 kg/m. If this is out of the aforementioned range listed, please consider follow up with your Primary Care Provider.  If you are age 32 or younger, your body mass index should be between 19-25. Your Body mass index is 22.28 kg/m. If this is out of the aformentioned range listed, please consider follow up with your Primary Care Provider.   ________________________________________________________  The [place] [facility] providers would like to encourage you to use [facility] to communicate with providers for non-urgent requests or questions.  Due to long hold times on the telephone, sending your provider a message by [facility] may be a faster and more efficient way to get a response.  Please allow 48 business hours for a response.  Please remember that this is for non-urgent requests.  _______________________________________________________  Have CBC and CMP checked every year  Continue current diet of gluten/soy and dairy free  Please follow up in 12 months. Give [place] a call at [phone] to schedule an appointment.  Thank you,  Dr. Lynann Bologna

## 2022-02-04 NOTE — Progress Notes (Signed)
Chief Complaint: FU  Referring Provider: Dr. Maryjean Ka at [facility]      ASSESSMENT AND PLAN;   #1. NAFLD (on Liver Bx 05/2009-showing capsular fibrosis, mild NASH, no cirrhosis). Neg acute viral hepatitis panel, autoimmune work-up, iron studies, ATP7B for Wilson's disease, celiac screen, A1AT. [place] neg 09/15/2018, CT 09/2019. Nl LFTs. Neg USE as below #2.  Non-celiac gluten sensitivity #3.  IBS-C with Abdo bloating.  Diet controlled. Nl recent TSH. Neg colon Nov 2022.  Plan: - Check CBC, CMP ever year. - Continue current gluten/soy/diary free diet. - FU in 1 year. At FU, consider [place] or USE.   HPI:    Terri Matthews is a 56 y.o. female   FU  Doing very well from GI standpoint.  She is on gluten-free, soy free and dairy-free diet.  No significant abdominal bloating as long as she sticks to the diet.  NO constipation as long as she keeps up with her water intake and salads.  No diarrhea.  She has been moving her bowels once or twice per day.  No melena or hematochezia.  No Blood in the stool or urine.  Came back from a trip to [place]-saw all 5 national [employer] to [place], [facility].   SH- Moved from [place], [place]. Married  Past surgical history: -Appendicectomy -History of kidney stones -Partial hysterectomy with partial small bowel resection 2011 due to endometriosis. -Liver biopsy 06/11/2009 showing mild steatosis   Past GI recent work-up:  USE: 12/26/2021 IMPRESSION: ULTRASOUND ABDOMEN: Probable fatty infiltration of liver. Otherwise negative exam. ULTRASOUND HEPATIC ELASTOGRAPHY: Median kPa:  4.6 (0.6 in report) Diagnostic category:  < or = 5 kPa: high probability of being normal   CT Abdo/pelvis with contrast 09/2019 1. No acute findings are noted in the abdomen or pelvis to account for the patient's symptoms. Specifically, no evidence of significant diverticular disease or diverticulitis.  [place] 08/2018 Possible small  nonobstructing left midpole renal stone versus artifact. No acute findings in the abdomen.  Colonoscopy November 2022 - Mild pancolonic diverticulosis. - Non-bleeding internal hemorrhoids. - The examination was otherwise normal on direct and retroflexion views. - No specimens collected. - Rpt 10 yrs. Earlier, if with any new problems or change in family history Past Medical History:  Diagnosis Date   Allergy    gluten severe reaction   Diaphragmatic hernia without obstruction or gangrene    Diverticulosis    GERD (gastroesophageal reflux disease)    Sullivan Lone syndrome    Gluten intolerance 2010   Hypothyroidism    IBS (irritable bowel syndrome)    NASH (nonalcoholic steatohepatitis)    Past Surgical History:  Procedure Laterality Date   Abdominal laprascopy     x3 for endometriosis   APPENDECTOMY     COLONOSCOPY  08/01/2010   ESOPHAGOGASTRODUODENOSCOPY  06/02/2013   Minimal transient hiatal hernia. Mild gastrtiis. Otherwise normal EGD.    Ganglion cysts removal     KIDNEY STONE SURGERY  2005   PARTIAL HYSTERECTOMY     UPPER GASTROINTESTINAL ENDOSCOPY      Family History  Problem Relation Age of Onset   Colon polyps Mother    Liver cancer Paternal Aunt        stage 4 liver cancer    Colon cancer Neg Hx    Esophageal cancer Neg Hx    Rectal cancer Neg Hx    Stomach cancer Neg Hx     Social History   Tobacco Use   Smoking status:  Never   Smokeless tobacco: Never  Vaping Use   Vaping Use: Never used  Substance Use Topics   Alcohol use: Yes    Comment: Occassional   Drug use: Never    Current Outpatient Medications  Medication Sig Dispense Refill   magnesium 30 MG tablet Take 30 mg by mouth daily.     Multiple Vitamin (MULTIVITAMIN) tablet Take 1 tablet by mouth daily.     Polyvinyl Alcohol-Povidone (REFRESH OP) Apply to eye.     rizatriptan (MAXALT) 10 MG tablet      thyroid (ARMOUR) 30 MG tablet Take by mouth daily.     White Petrolatum-Mineral Oil  (ARTIFICIAL TEARS) ointment daily.     No current facility-administered medications for this visit.    Allergies  Allergen Reactions   Erythromycin Hives    Upset stomach    Other Other (See Comments)    Severe Gluten intolerance and reaction, liver inflammation and scarring, Severe abominal pain and spasms, migraine,    Sulfa Antibiotics    Sulfamethoxazole-Trimethoprim Other (See Comments)    Unknown    Review of Systems:  neg     Physical Exam:    BP 112/70   Pulse 65   Ht 5\' 4"  (1.626 m)   Wt 129 lb 12.8 oz (58.9 kg)   SpO2 97%   BMI 22.28 kg/m  Filed Weights   02/04/22 1330  Weight: 129 lb 12.8 oz (58.9 kg)  Gen: awake, alert, NAD HEENT: anicteric, no pallor CV: RRR, no mrg Pulm: CTA b/l Abd: soft, RLQ tenderness without rebound, +BS throughout Ext: no c/c/e Neuro: nonfocal   14/12/23, MD 02/04/2022, 1:37 PM  Cc: Dr 14/01/2022.

## 2022-02-07 DIAGNOSIS — M25552 Pain in left hip: Secondary | ICD-10-CM | POA: Diagnosis not present

## 2022-02-07 DIAGNOSIS — Z1231 Encounter for screening mammogram for malignant neoplasm of breast: Secondary | ICD-10-CM | POA: Diagnosis not present

## 2022-02-07 DIAGNOSIS — M6258 Muscle wasting and atrophy, not elsewhere classified, other site: Secondary | ICD-10-CM | POA: Diagnosis not present

## 2022-02-19 DIAGNOSIS — M25552 Pain in left hip: Secondary | ICD-10-CM | POA: Diagnosis not present

## 2022-02-19 DIAGNOSIS — M6258 Muscle wasting and atrophy, not elsewhere classified, other site: Secondary | ICD-10-CM | POA: Diagnosis not present

## 2022-03-10 DIAGNOSIS — M25552 Pain in left hip: Secondary | ICD-10-CM | POA: Diagnosis not present

## 2022-03-10 DIAGNOSIS — M6258 Muscle wasting and atrophy, not elsewhere classified, other site: Secondary | ICD-10-CM | POA: Diagnosis not present

## 2022-03-24 DIAGNOSIS — M25552 Pain in left hip: Secondary | ICD-10-CM | POA: Diagnosis not present

## 2022-03-24 DIAGNOSIS — M6258 Muscle wasting and atrophy, not elsewhere classified, other site: Secondary | ICD-10-CM | POA: Diagnosis not present

## 2022-03-24 DIAGNOSIS — M25561 Pain in right knee: Secondary | ICD-10-CM | POA: Diagnosis not present

## 2022-03-24 DIAGNOSIS — M25562 Pain in left knee: Secondary | ICD-10-CM | POA: Diagnosis not present

## 2022-05-01 DIAGNOSIS — J329 Chronic sinusitis, unspecified: Secondary | ICD-10-CM | POA: Diagnosis not present

## 2022-05-06 DIAGNOSIS — J00 Acute nasopharyngitis [common cold]: Secondary | ICD-10-CM | POA: Diagnosis not present

## 2022-05-06 DIAGNOSIS — Z6821 Body mass index (BMI) 21.0-21.9, adult: Secondary | ICD-10-CM | POA: Diagnosis not present

## 2022-05-09 DIAGNOSIS — J00 Acute nasopharyngitis [common cold]: Secondary | ICD-10-CM | POA: Diagnosis not present

## 2022-06-05 DIAGNOSIS — K900 Celiac disease: Secondary | ICD-10-CM | POA: Diagnosis not present

## 2022-06-05 DIAGNOSIS — J040 Acute laryngitis: Secondary | ICD-10-CM | POA: Diagnosis not present

## 2022-06-05 DIAGNOSIS — K219 Gastro-esophageal reflux disease without esophagitis: Secondary | ICD-10-CM | POA: Diagnosis not present

## 2022-06-05 DIAGNOSIS — J0140 Acute pansinusitis, unspecified: Secondary | ICD-10-CM | POA: Diagnosis not present

## 2022-06-09 DIAGNOSIS — N952 Postmenopausal atrophic vaginitis: Secondary | ICD-10-CM | POA: Diagnosis not present

## 2022-07-02 DIAGNOSIS — H6993 Unspecified Eustachian tube disorder, bilateral: Secondary | ICD-10-CM | POA: Diagnosis not present

## 2022-09-03 DIAGNOSIS — R350 Frequency of micturition: Secondary | ICD-10-CM | POA: Diagnosis not present

## 2022-09-03 DIAGNOSIS — Z6821 Body mass index (BMI) 21.0-21.9, adult: Secondary | ICD-10-CM | POA: Diagnosis not present

## 2022-09-03 DIAGNOSIS — M7989 Other specified soft tissue disorders: Secondary | ICD-10-CM | POA: Diagnosis not present

## 2022-10-20 DIAGNOSIS — E039 Hypothyroidism, unspecified: Secondary | ICD-10-CM | POA: Diagnosis not present

## 2022-11-05 DIAGNOSIS — M2242 Chondromalacia patellae, left knee: Secondary | ICD-10-CM | POA: Diagnosis not present

## 2022-11-05 DIAGNOSIS — M25562 Pain in left knee: Secondary | ICD-10-CM | POA: Diagnosis not present

## 2022-11-25 DIAGNOSIS — S6391XA Sprain of unspecified part of right wrist and hand, initial encounter: Secondary | ICD-10-CM | POA: Diagnosis not present

## 2022-11-25 DIAGNOSIS — M79641 Pain in right hand: Secondary | ICD-10-CM | POA: Diagnosis not present

## 2023-01-09 ENCOUNTER — Encounter: Payer: Self-pay | Admitting: Gastroenterology

## 2023-01-19 DIAGNOSIS — Z1231 Encounter for screening mammogram for malignant neoplasm of breast: Secondary | ICD-10-CM | POA: Diagnosis not present

## 2023-01-19 DIAGNOSIS — M1712 Unilateral primary osteoarthritis, left knee: Secondary | ICD-10-CM | POA: Diagnosis not present

## 2023-01-19 DIAGNOSIS — Z01419 Encounter for gynecological examination (general) (routine) without abnormal findings: Secondary | ICD-10-CM | POA: Diagnosis not present

## 2023-01-26 DIAGNOSIS — M25562 Pain in left knee: Secondary | ICD-10-CM | POA: Diagnosis not present

## 2023-02-03 DIAGNOSIS — Z1331 Encounter for screening for depression: Secondary | ICD-10-CM | POA: Diagnosis not present

## 2023-02-03 DIAGNOSIS — Z0000 Encounter for general adult medical examination without abnormal findings: Secondary | ICD-10-CM | POA: Diagnosis not present

## 2023-02-03 DIAGNOSIS — Z1339 Encounter for screening examination for other mental health and behavioral disorders: Secondary | ICD-10-CM | POA: Diagnosis not present

## 2023-02-03 DIAGNOSIS — Z6822 Body mass index (BMI) 22.0-22.9, adult: Secondary | ICD-10-CM | POA: Diagnosis not present

## 2023-02-04 DIAGNOSIS — E538 Deficiency of other specified B group vitamins: Secondary | ICD-10-CM | POA: Diagnosis not present

## 2023-02-04 DIAGNOSIS — K900 Celiac disease: Secondary | ICD-10-CM | POA: Diagnosis not present

## 2023-02-04 DIAGNOSIS — E559 Vitamin D deficiency, unspecified: Secondary | ICD-10-CM | POA: Diagnosis not present

## 2023-02-04 DIAGNOSIS — E785 Hyperlipidemia, unspecified: Secondary | ICD-10-CM | POA: Diagnosis not present

## 2023-02-04 DIAGNOSIS — Z131 Encounter for screening for diabetes mellitus: Secondary | ICD-10-CM | POA: Diagnosis not present

## 2023-02-04 DIAGNOSIS — M654 Radial styloid tenosynovitis [de Quervain]: Secondary | ICD-10-CM | POA: Diagnosis not present

## 2023-02-04 DIAGNOSIS — E039 Hypothyroidism, unspecified: Secondary | ICD-10-CM | POA: Diagnosis not present

## 2023-02-09 DIAGNOSIS — Z1231 Encounter for screening mammogram for malignant neoplasm of breast: Secondary | ICD-10-CM | POA: Diagnosis not present

## 2023-02-16 DIAGNOSIS — K900 Celiac disease: Secondary | ICD-10-CM | POA: Diagnosis not present

## 2023-02-16 DIAGNOSIS — Z131 Encounter for screening for diabetes mellitus: Secondary | ICD-10-CM | POA: Diagnosis not present

## 2023-03-03 DIAGNOSIS — M654 Radial styloid tenosynovitis [de Quervain]: Secondary | ICD-10-CM | POA: Diagnosis not present

## 2023-03-03 DIAGNOSIS — Z131 Encounter for screening for diabetes mellitus: Secondary | ICD-10-CM | POA: Diagnosis not present

## 2023-03-17 DIAGNOSIS — D225 Melanocytic nevi of trunk: Secondary | ICD-10-CM | POA: Diagnosis not present

## 2023-03-17 DIAGNOSIS — L821 Other seborrheic keratosis: Secondary | ICD-10-CM | POA: Diagnosis not present

## 2023-03-17 DIAGNOSIS — D2239 Melanocytic nevi of other parts of face: Secondary | ICD-10-CM | POA: Diagnosis not present

## 2023-03-17 DIAGNOSIS — L578 Other skin changes due to chronic exposure to nonionizing radiation: Secondary | ICD-10-CM | POA: Diagnosis not present

## 2023-03-20 DIAGNOSIS — M654 Radial styloid tenosynovitis [de Quervain]: Secondary | ICD-10-CM | POA: Diagnosis not present
